# Patient Record
Sex: Female | Born: 1975 | Race: White | Hispanic: No | Marital: Married | Smoking: Never smoker
Health system: Southern US, Community
[De-identification: ages and names within clinical notes are randomized; demographics above are authoritative.]

## PROBLEM LIST (undated history)

## (undated) DIAGNOSIS — R7303 Prediabetes: Secondary | ICD-10-CM

## (undated) DIAGNOSIS — Z8041 Family history of malignant neoplasm of ovary: Secondary | ICD-10-CM

## (undated) DIAGNOSIS — G902 Horner's syndrome: Secondary | ICD-10-CM

## (undated) DIAGNOSIS — K219 Gastro-esophageal reflux disease without esophagitis: Secondary | ICD-10-CM

## (undated) DIAGNOSIS — E559 Vitamin D deficiency, unspecified: Secondary | ICD-10-CM

## (undated) DIAGNOSIS — E282 Polycystic ovarian syndrome: Secondary | ICD-10-CM

## (undated) DIAGNOSIS — M1990 Unspecified osteoarthritis, unspecified site: Secondary | ICD-10-CM

## (undated) DIAGNOSIS — Z1371 Encounter for nonprocreative screening for genetic disease carrier status: Secondary | ICD-10-CM

## (undated) HISTORY — DX: Vitamin D deficiency, unspecified: E55.9

## (undated) HISTORY — DX: Unspecified osteoarthritis, unspecified site: M19.90

## (undated) HISTORY — DX: Horner's syndrome: G90.2

## (undated) HISTORY — DX: Encounter for nonprocreative screening for genetic disease carrier status: Z13.71

## (undated) HISTORY — PX: HERNIA REPAIR: SHX51

## (undated) HISTORY — DX: Prediabetes: R73.03

## (undated) HISTORY — DX: Polycystic ovarian syndrome: E28.2

## (undated) HISTORY — PX: SMALL INTESTINE SURGERY: SHX150

## (undated) HISTORY — DX: Gastro-esophageal reflux disease without esophagitis: K21.9

## (undated) HISTORY — DX: Family history of malignant neoplasm of ovary: Z80.41

---

## 1996-10-08 HISTORY — PX: TUBAL LIGATION: SHX77

## 2003-10-09 HISTORY — PX: BREAST BIOPSY: SHX20

## 2005-01-22 ENCOUNTER — Other Ambulatory Visit: Admission: RE | Admit: 2005-01-22 | Discharge: 2005-01-22 | Payer: Self-pay | Admitting: Obstetrics & Gynecology

## 2005-02-01 ENCOUNTER — Ambulatory Visit (HOSPITAL_COMMUNITY): Admission: RE | Admit: 2005-02-01 | Discharge: 2005-02-01 | Payer: Self-pay | Admitting: Obstetrics & Gynecology

## 2009-08-03 ENCOUNTER — Ambulatory Visit: Payer: Self-pay | Admitting: Family Medicine

## 2010-01-06 ENCOUNTER — Ambulatory Visit: Payer: Self-pay | Admitting: Obstetrics and Gynecology

## 2010-01-10 ENCOUNTER — Ambulatory Visit: Payer: Self-pay | Admitting: Obstetrics and Gynecology

## 2012-02-11 ENCOUNTER — Ambulatory Visit: Payer: Self-pay | Admitting: Surgery

## 2012-02-11 LAB — CBC
HCT: 37.3 % (ref 35.0–47.0)
HGB: 12.6 g/dL (ref 12.0–16.0)
MCH: 31.9 pg (ref 26.0–34.0)
MCHC: 33.8 g/dL (ref 32.0–36.0)
MCV: 94 fL (ref 80–100)
Platelet: 282 10*3/uL (ref 150–440)
RBC: 3.96 10*6/uL (ref 3.80–5.20)
RDW: 16.1 % — ABNORMAL HIGH (ref 11.5–14.5)
WBC: 6.4 10*3/uL (ref 3.6–11.0)

## 2012-02-11 LAB — BASIC METABOLIC PANEL
Anion Gap: 5 — ABNORMAL LOW (ref 7–16)
BUN: 9 mg/dL (ref 7–18)
Calcium, Total: 9.3 mg/dL (ref 8.5–10.1)
Chloride: 103 mmol/L (ref 98–107)
Co2: 29 mmol/L (ref 21–32)
Creatinine: 0.53 mg/dL — ABNORMAL LOW (ref 0.60–1.30)
EGFR (African American): >60
EGFR (Non-African Amer.): >60
Glucose: 86 mg/dL (ref 65–99)
Osmolality: 272 (ref 275–301)
Potassium: 4.4 mmol/L (ref 3.5–5.1)
Sodium: 137 mmol/L (ref 136–145)

## 2012-02-15 ENCOUNTER — Ambulatory Visit: Payer: Self-pay | Admitting: Surgery

## 2012-02-15 LAB — PREGNANCY, URINE: Pregnancy Test, Urine: NEGATIVE m[IU]/mL

## 2012-02-18 LAB — PATHOLOGY REPORT

## 2012-08-19 ENCOUNTER — Ambulatory Visit: Payer: Self-pay | Admitting: Family Medicine

## 2014-03-08 HISTORY — PX: ABDOMINAL SURGERY: SHX537

## 2014-03-08 HISTORY — PX: FEMUR SURGERY: SHX943

## 2014-04-26 ENCOUNTER — Other Ambulatory Visit: Payer: Self-pay | Admitting: *Deleted

## 2014-04-26 DIAGNOSIS — S2249XA Multiple fractures of ribs, unspecified side, initial encounter for closed fracture: Secondary | ICD-10-CM

## 2014-04-26 DIAGNOSIS — E669 Obesity, unspecified: Secondary | ICD-10-CM | POA: Insufficient documentation

## 2014-04-26 DIAGNOSIS — K449 Diaphragmatic hernia without obstruction or gangrene: Secondary | ICD-10-CM | POA: Insufficient documentation

## 2014-04-26 DIAGNOSIS — J939 Pneumothorax, unspecified: Secondary | ICD-10-CM

## 2014-04-26 HISTORY — DX: Pneumothorax, unspecified: J93.9

## 2014-04-26 HISTORY — DX: Multiple fractures of ribs, unspecified side, initial encounter for closed fracture: S22.49XA

## 2014-04-26 MED ORDER — OXYCODONE HCL 5 MG PO CAPS: 5.0000 mg | ORAL_CAPSULE | Freq: Four times a day (QID) | ORAL | Status: DC | PRN

## 2014-04-26 NOTE — Telephone Encounter (Signed)
rx faxed to pharmacy manually

## 2014-04-29 DIAGNOSIS — S2249XA Multiple fractures of ribs, unspecified side, initial encounter for closed fracture: Secondary | ICD-10-CM

## 2014-04-29 DIAGNOSIS — S7290XA Unspecified fracture of unspecified femur, initial encounter for closed fracture: Secondary | ICD-10-CM

## 2014-04-29 DIAGNOSIS — R7301 Impaired fasting glucose: Secondary | ICD-10-CM

## 2014-04-29 DIAGNOSIS — S82899A Other fracture of unspecified lower leg, initial encounter for closed fracture: Secondary | ICD-10-CM

## 2014-04-29 DIAGNOSIS — G909 Disorder of the autonomic nervous system, unspecified: Secondary | ICD-10-CM

## 2014-05-07 DIAGNOSIS — R232 Flushing: Secondary | ICD-10-CM

## 2014-05-19 DIAGNOSIS — B379 Candidiasis, unspecified: Secondary | ICD-10-CM

## 2014-05-20 DIAGNOSIS — R7301 Impaired fasting glucose: Secondary | ICD-10-CM

## 2014-07-12 ENCOUNTER — Encounter: Payer: Self-pay | Admitting: Orthopedic Surgery

## 2014-08-08 ENCOUNTER — Encounter: Payer: Self-pay | Admitting: Orthopedic Surgery

## 2014-08-20 ENCOUNTER — Ambulatory Visit: Payer: Self-pay | Admitting: Family Medicine

## 2014-09-07 ENCOUNTER — Encounter: Payer: Self-pay | Admitting: Orthopedic Surgery

## 2014-10-08 ENCOUNTER — Encounter: Payer: Self-pay | Admitting: Orthopedic Surgery

## 2014-11-08 ENCOUNTER — Encounter: Payer: Self-pay | Admitting: Orthopedic Surgery

## 2014-12-07 ENCOUNTER — Encounter
Admit: 2014-12-07 | Disposition: A | Discharge status: Home / Self Care | Payer: Self-pay | Attending: Orthopedic Surgery | Admitting: Orthopedic Surgery

## 2015-01-30 NOTE — Op Note (Signed)
  Loreli Dollar MD ELECTRONICALLY SIGNED 02/15/2012 18:58

## 2015-01-30 NOTE — Op Note (Signed)
PATIENT NAME:  Mariah Martin, Mariah Martin MR#:  [id number] DATE OF BIRTH:  Jan 31, 1976  DATE OF PROCEDURE:  02/15/2012  PREOPERATIVE DIAGNOSIS:  Pilonidal cyst.   POSTOPERATIVE DIAGNOSIS: Pilonidal cyst.  PROCEDURE: Excision of pilonidal cyst.   ANESTHESIA: General.   INDICATIONS: This 39 year old female has a history of abscessed pilonidal cyst. The infection has subsequently resolved and excision was recommended to avoid future infections.  DESCRIPTION OF PROCEDURE:  The patient was brought into the operating room under general endotracheal anesthesia she was rolled from the stretcher onto the operating table well padded and the buttocks were spread with 3-inch tape. Two pores could be seen in the sacrococcygeal area. This area was prepared with Betadine solution and draped in a sterile manner. The lowermost pore was probed with laryngeal probe and went in a cephalad direction  approximately 4 cm. Next, a longitudinally-oriented elliptical excision was carried out with a narrow ellipse of skin and dissected down around the probe and removed the entire tract. This was submitted in formalin for routine pathology. Multiple small bleeding points were cauterized. The tissues were infiltrated with 0.5% Sensorcaine with epinephrine and the wound was observed for a period of time. Several small bleeding points were cauterized. Hemostasis subsequently appeared to be intact. Next portions of subcutaneous tissues were closed with 4-0 Monocryl. Also portions of the wound were closed with interrupted 4-0 Monocryl subcuticular suture and then the wound was further approximated with interrupted 3-0 nylon vertical mattress sutures. Next, a suture line was treated with Betadine paint and then 4 x 4 gauze dressings were applied with benzoin and two-inch silk tape.   The patient tolerated the procedure satisfactorily and is now being prepared for transfer to the recovery room.      ____________________________ Lenna Sciara. Rochel Brome, MD jws:bjt D: 02/15/2012 10:25:57 ET T: 02/15/2012 12:33:18 ET JOB#: [id number]  cc: Loreli Dollar, MD, <Dictator> Loreli Dollar MD ELECTRONICALLY SIGNED 02/15/2012 18:58

## 2015-03-09 HISTORY — PX: OTHER SURGICAL HISTORY: SHX169

## 2015-04-08 HISTORY — PX: KNEE SURGERY: SHX244

## 2015-07-20 ENCOUNTER — Other Ambulatory Visit: Payer: Self-pay | Admitting: Family Medicine

## 2015-07-20 DIAGNOSIS — Z8709 Personal history of other diseases of the respiratory system: Secondary | ICD-10-CM | POA: Insufficient documentation

## 2015-07-20 DIAGNOSIS — R739 Hyperglycemia, unspecified: Secondary | ICD-10-CM | POA: Insufficient documentation

## 2015-07-20 NOTE — Telephone Encounter (Signed)
Last OV 10/2014  Thanks,   -Mickel Baas

## 2015-07-22 LAB — LIPID PANEL
Cholesterol: 198 mg/dL (ref 0–200)
HDL: 52 mg/dL (ref 35–70)
LDL Cholesterol: 104 mg/dL
LDl/HDL Ratio: 3.8
Triglycerides: 210 mg/dL — AB (ref 40–160)

## 2015-07-22 LAB — CBC AND DIFFERENTIAL
HCT: 35 % — AB (ref 36–46)
Hemoglobin: 11.2 g/dL — AB (ref 12.0–16.0)
Neutrophils Absolute: 5 /uL
Platelets: 312 10*3/uL (ref 150–399)
WBC: 7.8 10^3/mL

## 2015-07-22 LAB — BASIC METABOLIC PANEL
BUN: 10 mg/dL (ref 4–21)
Creatinine: 0.6 mg/dL (ref 0.5–1.1)
Glucose: 102 mg/dL
Potassium: 4.5 mmol/L (ref 3.4–5.3)
Sodium: 140 mmol/L (ref 137–147)

## 2015-07-22 LAB — HEPATIC FUNCTION PANEL
ALT: 8 U/L (ref 7–35)
AST: 11 U/L — AB (ref 13–35)
Alkaline Phosphatase: 106 U/L (ref 25–125)
Bilirubin, Total: 0.2 mg/dL

## 2015-07-22 LAB — TSH: TSH: 3.41 u[IU]/mL (ref 0.41–5.90)

## 2015-07-22 LAB — HEMOGLOBIN A1C: Hemoglobin A1C: 5.7

## 2015-08-03 ENCOUNTER — Ambulatory Visit: Payer: BLUE CROSS/BLUE SHIELD | Attending: Orthopedic Surgery | Admitting: Physical Therapy

## 2015-08-03 DIAGNOSIS — M25562 Pain in left knee: Secondary | ICD-10-CM | POA: Diagnosis present

## 2015-08-03 DIAGNOSIS — R262 Difficulty in walking, not elsewhere classified: Secondary | ICD-10-CM

## 2015-08-03 DIAGNOSIS — M25662 Stiffness of left knee, not elsewhere classified: Secondary | ICD-10-CM

## 2015-08-03 DIAGNOSIS — M6281 Muscle weakness (generalized): Secondary | ICD-10-CM

## 2015-08-03 DIAGNOSIS — M25571 Pain in right ankle and joints of right foot: Secondary | ICD-10-CM

## 2015-08-04 NOTE — Therapy (Deleted)
[facility] [address] [place], [place], [address] Phone: [phone]   Fax:  [phone]  Physical Therapy Evaluation  Patient Details  Name: Mariah Martin MRN: [id number] Date of Birth: 15-Oct-1975 No Data Recorded  Encounter Date: 08/03/2015    No past medical history on file.  No past surgical history on file.  There were no vitals filed for this visit.  Visit Diagnosis:  Left knee pain  Right ankle pain  Muscle weakness  Joint stiffness of knee, left  Difficulty walking    See certification    Problem List Patient Active Problem List   Diagnosis Date Noted  . Blood glucose elevated 07/20/2015  . H/O pneumothorax 07/20/2015   Pura Spice, PT, DPT # [phone]   08/04/2015, 5:41 PM  [facility] [address] [place], [place], [address] Phone: [phone]   Fax:  [phone]  Name: Mariah Martin MRN: [id number] Date of Birth: 1976/01/19

## 2015-08-08 ENCOUNTER — Encounter: Payer: Self-pay | Admitting: Physical Therapy

## 2015-08-09 ENCOUNTER — Ambulatory Visit: Payer: BLUE CROSS/BLUE SHIELD | Attending: Orthopedic Surgery | Admitting: Physical Therapy

## 2015-08-09 DIAGNOSIS — M25571 Pain in right ankle and joints of right foot: Secondary | ICD-10-CM | POA: Insufficient documentation

## 2015-08-09 DIAGNOSIS — M25562 Pain in left knee: Secondary | ICD-10-CM | POA: Insufficient documentation

## 2015-08-09 DIAGNOSIS — M6281 Muscle weakness (generalized): Secondary | ICD-10-CM | POA: Insufficient documentation

## 2015-08-09 DIAGNOSIS — R262 Difficulty in walking, not elsewhere classified: Secondary | ICD-10-CM | POA: Insufficient documentation

## 2015-08-09 DIAGNOSIS — M25662 Stiffness of left knee, not elsewhere classified: Secondary | ICD-10-CM

## 2015-08-09 NOTE — Therapy (Signed)
[place] [facility] [address] [place], [address] Phone: [phone]   Fax:  [phone]  Physical Therapy Treatment  Patient Details  Name: Mariah Martin MRN: [id number] Date of Birth: [birth year] Referring Provider: Drue Dun  Encounter Date: 08/09/2015      PT End of Session - 08/09/15 0842    Visit Number 2   Number of Visits 16   Date for PT Re-Evaluation 09/28/15   PT Start Time 0735   PT Stop Time 0820   PT Time Calculation (min) 45 min   Activity Tolerance Patient tolerated treatment well;No increased pain;Patient limited by fatigue   Behavior During Therapy [facility] for tasks assessed/performed      No past medical history on file.  No past surgical history on file.  There were no vitals filed for this visit.  Visit Diagnosis:  Muscle weakness  Joint stiffness of knee, left  Difficulty walking      Subjective Assessment - 08/09/15 0838    Subjective Patient reports she is doing great. Says she spent the weekend at the beach and had a good time, was able to get into the pool some. Reports knees feel tight, might be due to riding [employer] yesterday for 30 min.    Limitations Lifting;Standing;Walking;House hold activities;Other (comment)   Patient Stated Goals Increase L knee ROM/strength and return to walking with appropriate assistive device safely.    Currently in Pain? Yes   Pain Location Knee   Pain Orientation Right;Left   Pain Descriptors / Indicators Tightness   Pain Type Chronic pain   Pain Onset More than a month ago   Multiple Pain Sites No                     Adult Aquatic Therapy - 08/09/15 0840    Aquatic Therapy Subjective   Subjective Patient reports she is feeling great.    Treatment   Gait Entered/exited pool via ramp with handrail assist.  Performed 3 laps of forward, lateral and backward walking.    Exercises Standing exercises to include Hip  abduction, marching, hip extension, knee flexion, squats, heel raises, seated laq, each x 2 min. Kicking across pool x 1 with noodle assist.                     PT Education - 08/09/15 [phone]    Education provided Yes   Education Details aquatic exercises   Person(s) Educated Patient   Methods Explanation;Demonstration   Comprehension Verbalized understanding;Returned demonstration             PT Long Term Goals - 08/03/15 0823    PT LONG TERM GOAL #1   Title Pt. I with HEP to increase L knee AROM flexion to >100 deg. to improve transfers/ walking with appropriate assistive device.    Baseline 0 to 94 deg.   Time 4   Period Weeks   Status New   PT LONG TERM GOAL #2   Title Pt. will increase LEFS to >40 out of 80 to improve overall functional mobility with household/ work-related tasks.    Baseline 19 out of 80 on 10/26   Time 6   Period Weeks   Status New   PT LONG TERM GOAL #3   Title Pt. able to ambulate 100 feet with mod. I and RW on level surfaces at work to be able to go to copy room/ restroom safely.  Baseline currently using scooter   Time 6   Period Weeks   Status New   PT LONG TERM GOAL #4   Title Pt. able to ascend/descend 4 steps with proper step to gait pattern and 1 handrail safely.     Baseline pain limited with recip. pattern.   Time 8   Period Weeks   Status New   PT LONG TERM GOAL #5   Title Pt. will increase L hip/knee strength to 4+/5 MMT to improve standing tolerance/ endurance with gait pattern.     Time 8   Period Weeks   Status New   Additional Long Term Goals   Additional Long Term Goals Yes   PT LONG TERM GOAL #6   Title Pt. able to ambulate community distances with least assistive device with mod. I safely on level surfaces to improve overall mobility.     Time 8   Period Weeks   Status New               Plan - 08/09/15 0843    Clinical Impression Statement Patient performed all activities with cues and  demonstration in aquatic environment. Pt reports fatigue at end of session.    Pt will benefit from skilled therapeutic intervention in order to improve on the following deficits Abnormal gait;Decreased strength;Improper body mechanics;Postural dysfunction;Difficulty walking;Decreased activity tolerance;Decreased mobility;Impaired flexibility;Obesity;Decreased range of motion;Hypermobility;Decreased balance;Pain;Hypomobility;Decreased endurance;Decreased scar mobility   Rehab Potential Good   PT Frequency 2x / week   PT Duration 8 weeks   PT Treatment/Interventions ADLs/Self Care Home Management;DME Instruction;Gait training;Stair training;Aquatic Therapy;Functional mobility training;Manual techniques;Cryotherapy;Electrical Stimulation;Moist Heat;Patient/family education;Neuromuscular re-education;Balance training;Therapeutic exercise;Therapeutic activities;Passive range of motion;Energy conservation   Consulted and Agree with Plan of Care Patient        Problem List Patient Active Problem List   Diagnosis Date Noted  . Blood glucose elevated 07/20/2015  . H/O pneumothorax 07/20/2015    Teigan Manner, PT, MPT, GCS 08/09/2015, 8:45 AM  [place] [facility] [address], [place], [address] Phone: [phone]   Fax:  [phone]  Name: Mariah Martin MRN: [id number] Date of Birth: 1975/12/21

## 2015-08-09 NOTE — Addendum Note (Signed)
Addended by: Pura Spice on: 08/09/2015 07:38 AM   Modules accepted: Orders

## 2015-08-09 NOTE — Therapy (Signed)
[facility] [address] [place], [place], [address] Phone: [phone]   Fax:  [phone]  Physical Therapy Evaluation  Patient Details  Name: Mariah Martin MRN: [id number] Date of Birth: 1976-09-12 Referring Provider: Drue Dun  Encounter Date: 08/03/2015      PT End of Session - 08/09/15 0711    Visit Number 1   Number of Visits 16   Date for PT Re-Evaluation 09/28/15   PT Start Time 0725   PT Stop Time 0831   PT Time Calculation (min) 66 min   Equipment Utilized During Treatment Gait belt   Activity Tolerance Patient tolerated treatment well;Patient limited by fatigue   Behavior During Therapy [facility] for tasks assessed/performed      History reviewed. No pertinent past medical history.  History reviewed. No pertinent past surgical history.  There were no vitals filed for this visit.  Visit Diagnosis:  Left knee pain  Right ankle pain  Muscle weakness  Joint stiffness of knee, left  Difficulty walking      Subjective Assessment - 08/09/15 0707    Subjective Pt. states she was non-wt. bearing on L LE for past 3 months and relied on use of scooter at [employer].  Pt. states MD feels she will always require use of assistive device with walking due to limited L distal femur healing/ extent of hardware.  Pt. reports minimal L knee/LE pain at rest and increase discomfort with standing/walking tasks.     Limitations Lifting;Standing;Walking;House hold activities;Other (comment)   Patient Stated Goals Increase L knee ROM/strength and return to walking with appropriate assistive device safely.    Currently in Pain? 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 1   Has the patient had a decrease in activity level because of a fear of falling?  Yes   Is the patient reluctant to leave their home because of a fear of falling?  Yes           PT Education - 08/09/15 0710    Education provided Yes   Education Details Pool exercise program/ See HEP (quad sets/ heel slides/ SLR/ hip abd. in supine position).  Pt. will begin with in pool next Tuesday (08/09/15) with progression to land based ex.    Person(s) Educated Patient   Methods Explanation;Demonstration;Verbal cues;Handout   Comprehension Verbalized understanding;Returned demonstration;Verbal cues required             PT Long Term Goals - 08/03/15 0823    PT LONG TERM GOAL #1   Title Pt. I with HEP to increase L knee AROM flexion to >100 deg. to improve transfers/ walking with appropriate assistive device.    Baseline 0 to 94 deg.   Time 4   Period Weeks   Status New   PT LONG TERM GOAL #2   Title Pt. will increase LEFS to >40 out of 80 to improve overall functional mobility with household/ work-related  tasks.    Baseline 19 out of 80 on 10/26   Time 6   Period Weeks   Status New   PT LONG TERM GOAL #3   Title Pt. able to ambulate 100 feet with mod. I and RW on level surfaces at work to be able to go to copy room/ restroom safely.    Baseline currently using scooter   Time 6   Period Weeks   Status New   PT LONG TERM GOAL #4   Title Pt. able to ascend/descend 4 steps with proper step to gait pattern and 1 handrail safely.     Baseline pain limited with recip. pattern.   Time 8   Period Weeks   Status New   PT LONG TERM GOAL #5   Title Pt. will increase L hip/knee strength to 4+/5 MMT to improve standing tolerance/ endurance with gait pattern.     Time 8   Period Weeks   Status New   Additional Long Term Goals   Additional  Long Term Goals Yes   PT LONG TERM GOAL #6   Title Pt. able to ambulate community distances with least assistive device with mod. I safely on level surfaces to improve overall mobility.     Time 8   Period Weeks   Status New               Plan - 08/09/15 3664    Clinical Impression Statement Pt. 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 bearing.  Pt. know to PT after initial surgery and progression to independent gym based ex. program pror to revison.  Pt. reports minimal L knee pain at rest in sitting posture and an increase in L knee pain >2/10 with sit to standing/ static standing/walking in clinic with use of RW.  Pt. presents with decrease L knee AROM in supine (0 to 94 deg.) and PROM (+3 to 98 deg.)- pain limited/ muscle tightness.  Significant L < R LLD noted in supine/standing by >1.5 inches and benefits from external shoe lift on L with standing/wlaking to improve hip symmetry.  R LE AROM WNL and strength grossly 5/5 MMT.  L LE strength: hamstring 5/5 MMT, quad 3+/5, hip flexor 3+/5 and DF 5/5 MMT.  LEFS: 19 out of 80.  Pt. able to ambulates short distances (<50 feet) in clinic with use of RW and L antalgic gait pattern with decrease stance time on L with R swing through phase of gait.  Decrease L hip/knee flexion with recip. step pattern and significant use of B UE with wt. bearing/ posture correction.  Pt. able to ascend/ descend 4 step with step to pattern and 1 handrail safely with extra time and min. A required for verbal suing.  Pt. understands importance of HEP and will start PT in pool to progress wt. bearing tolerance/ walking/ endurance prior to progressing with land based ex.  Pt. will benefit from PT 2x/week x 8 weeks at this time with progressive HEP to improve walking status/ decrease use of scooter.     Pt will benefit from skilled therapeutic intervention in order to improve on the  following deficits Abnormal gait;Decreased strength;Improper body mechanics;Postural dysfunction;Difficulty walking;Decreased activity tolerance;Decreased mobility;Impaired flexibility;Obesity;Decreased range of motion;Hypermobility;Decreased balance;Pain;Hypomobility;Decreased endurance;Decreased scar mobility   Rehab Potential Good   PT Frequency 2x / week   PT Duration 8 weeks   PT Treatment/Interventions ADLs/Self Care  Home Management;DME Instruction;Gait training;Stair training;Aquatic Therapy;Functional mobility training;Manual techniques;Cryotherapy;Electrical Stimulation;Moist Heat;Patient/family education;Neuromuscular re-education;Balance training;Therapeutic exercise;Therapeutic activities;Passive range of motion;Energy conservation   PT Next Visit Plan Pool ex. program (issue an independent pool based ex. program so pt. can progress to [facility] pool on an independent basis with min. assist from [place] [facility]).     PT Home Exercise Plan see handouts   Recommended Other Services [place] pool/ gym.     Consulted and Agree with Plan of Care Patient         Problem List Patient Active Problem List   Diagnosis Date Noted  . Blood glucose elevated 07/20/2015  . H/O pneumothorax 07/20/2015   Pura Spice, PT, DPT # [phone]   08/09/2015, 7:33 AM  [facility] [address] [place], [place], [address] Phone: [phone]   Fax:  [phone]  Name: Mariah Martin MRN: [id number] Date of Birth: 1976/03/03

## 2015-08-11 ENCOUNTER — Ambulatory Visit: Payer: BLUE CROSS/BLUE SHIELD | Admitting: Physical Therapy

## 2015-08-16 ENCOUNTER — Ambulatory Visit: Payer: BLUE CROSS/BLUE SHIELD | Admitting: Physical Therapy

## 2015-08-16 DIAGNOSIS — M25571 Pain in right ankle and joints of right foot: Secondary | ICD-10-CM

## 2015-08-16 DIAGNOSIS — M25662 Stiffness of left knee, not elsewhere classified: Secondary | ICD-10-CM

## 2015-08-16 DIAGNOSIS — M25562 Pain in left knee: Secondary | ICD-10-CM

## 2015-08-16 DIAGNOSIS — M6281 Muscle weakness (generalized): Secondary | ICD-10-CM

## 2015-08-16 DIAGNOSIS — R262 Difficulty in walking, not elsewhere classified: Secondary | ICD-10-CM

## 2015-08-16 NOTE — Therapy (Signed)
[place] [facility] [address], [place], [address] Phone: [phone]   Fax:  [phone]  Physical Therapy Treatment  Patient Details  Name: Mariah Martin MRN: [id number] Date of Birth: 1976/04/16 Referring Provider: Drue Dun  Encounter Date: 08/16/2015      PT End of Session - 08/16/15 0819    Visit Number 3   Number of Visits 16   Date for PT Re-Evaluation 09/28/15   PT Start Time 0710   PT Stop Time 0800   PT Time Calculation (min) 50 min   Activity Tolerance Patient tolerated treatment well;No increased pain;Patient limited by fatigue   Behavior During Therapy [facility] for tasks assessed/performed      No past medical history on file.  No past surgical history on file.  There were no vitals filed for this visit.  Visit Diagnosis:  Muscle weakness  Joint stiffness of knee, left  Difficulty walking  Left knee pain  Right ankle pain      Subjective Assessment - 08/16/15 0815    Subjective Patient states she is good, reports left knee tightness.    Limitations Lifting;Standing;Walking;House hold activities;Other (comment)   Patient Stated Goals Increase L knee ROM/strength and return to walking with appropriate assistive device safely.    Currently in Pain? No/denies                     Adult Aquatic Therapy - 08/16/15 0816    Aquatic Therapy Subjective   Subjective Patient reports left knee stiffness and right ankle swelling   Treatment   Gait Entered/exited pool via ramp with handrail assist.  Performed 3 laps of forward, lateral and backward walking.    Exercises Standing exercises to include Hip abduction, marching, hip extension, knee flexion, squats, heel raises, seated laq, each x 2 min. Kicking across pool x 2 with kickboard/noodle assist ( 1x prone 1x supine.).                     PT Education - 08/16/15 0817    Education provided Yes   Education  Details aquatic exercises   Person(s) Educated Patient   Methods Explanation;Demonstration;Verbal cues   Comprehension Verbalized understanding;Returned demonstration;Verbal cues required             PT Long Term Goals - 08/03/15 0823    PT LONG TERM GOAL #1   Title Pt. I with HEP to increase L knee AROM flexion to >100 deg. to improve transfers/ walking with appropriate assistive device.    Baseline 0 to 94 deg.   Time 4   Period Weeks   Status New   PT LONG TERM GOAL #2   Title Pt. will increase LEFS to >40 out of 80 to improve overall functional mobility with household/ work-related tasks.    Baseline 19 out of 80 on 10/26   Time 6   Period Weeks   Status New   PT LONG TERM GOAL #3   Title Pt. able to ambulate 100 feet with mod. I and RW on level surfaces at work to be able to go to copy room/ restroom safely.    Baseline currently using scooter   Time 6   Period Weeks   Status New   PT LONG TERM GOAL #4   Title Pt. able to ascend/descend 4 steps with proper step to gait pattern and 1 handrail safely.     Baseline pain limited with recip.  pattern.   Time 8   Period Weeks   Status New   PT LONG TERM GOAL #5   Title Pt. will increase L hip/knee strength to 4+/5 MMT to improve standing tolerance/ endurance with gait pattern.     Time 8   Period Weeks   Status New   Additional Long Term Goals   Additional Long Term Goals Yes   PT LONG TERM GOAL #6   Title Pt. able to ambulate community distances with least assistive device with mod. I safely on level surfaces to improve overall mobility.     Time 8   Period Weeks   Status New               Plan - 08/16/15 0820    Clinical Impression Statement Patient doing well with all aquatic activities. Patient reports feeling improved mobility in left knee after session.    Pt will benefit from skilled therapeutic intervention in order to improve on the following deficits Abnormal gait;Decreased strength;Improper body  mechanics;Postural dysfunction;Difficulty walking;Decreased activity tolerance;Decreased mobility;Impaired flexibility;Obesity;Decreased range of motion;Hypermobility;Decreased balance;Pain;Hypomobility;Decreased endurance;Decreased scar mobility   Rehab Potential Good   PT Frequency 2x / week   PT Duration 8 weeks   PT Treatment/Interventions ADLs/Self Care Home Management;DME Instruction;Gait training;Stair training;Aquatic Therapy;Functional mobility training;Manual techniques;Cryotherapy;Electrical Stimulation;Moist Heat;Patient/family education;Neuromuscular re-education;Balance training;Therapeutic exercise;Therapeutic activities;Passive range of motion;Energy conservation   PT Next Visit Plan Pool ex. program (issue an independent pool based ex. program so pt. can progress to [facility] pool on an independent basis with min. assist from [place] [facility]).     Consulted and Agree with Plan of Care Patient        Problem List Patient Active Problem List   Diagnosis Date Noted  . Blood glucose elevated 07/20/2015  . H/O pneumothorax 07/20/2015    Telisha Zawadzki, PT, MPT, GCS 08/16/2015, 8:22 AM  [place] [facility] [address] [place], [address] Phone: [phone]   Fax:  [phone]  Name: Mariah Martin MRN: [id number] Date of Birth: 16-Jul-1976

## 2015-08-18 ENCOUNTER — Ambulatory Visit: Payer: BLUE CROSS/BLUE SHIELD | Admitting: Physical Therapy

## 2015-08-18 DIAGNOSIS — R262 Difficulty in walking, not elsewhere classified: Secondary | ICD-10-CM

## 2015-08-18 DIAGNOSIS — M6281 Muscle weakness (generalized): Secondary | ICD-10-CM

## 2015-08-18 DIAGNOSIS — M25662 Stiffness of left knee, not elsewhere classified: Secondary | ICD-10-CM

## 2015-08-18 NOTE — Therapy (Signed)
[facility] [address], [place], [address] Phone: [phone]   Fax:  [phone]  Physical Therapy Treatment  Patient Details  Name: Mariah Martin MRN: [id number] Date of Birth: Jun 20, 1976 Referring Provider: Drue Dun  Encounter Date: 08/18/2015      PT End of Session - 08/18/15 0840    Visit Number 4   Number of Visits 16   Date for PT Re-Evaluation 09/28/15   PT Start Time 0720   PT Stop Time 0815   PT Time Calculation (min) 55 min   Activity Tolerance Patient tolerated treatment well;No increased pain   Behavior During Therapy [facility] for tasks assessed/performed      No past medical history on file.  No past surgical history on file.  There were no vitals filed for this visit.  Visit Diagnosis:  Muscle weakness  Joint stiffness of knee, left  Difficulty walking      Subjective Assessment - 08/18/15 0836    Subjective Patient reports she feels tight in the am in her left knee.   Limitations Lifting;Standing;Walking;House hold activities;Other (comment)   Patient Stated Goals Increase L knee ROM/strength and return to walking with appropriate assistive device safely.    Currently in Pain? No/denies                     Adult Aquatic Therapy - 08/18/15 0837    Aquatic Therapy Subjective   Subjective Patient reports left knee stiffness and right ankle swelling   Treatment   Gait Entered/exited pool via ramp with handrail assist.  Performed 6 laps of forward, 6 laps lateral and 3 laps backward walking.    Exercises Standing exercises to include Hip abduction, marching, hip extension, knee flexion, squats, seated bicycles, laq, each x 2 min. Kicking across pool x 2 laps with kickboard assist and 1x across pool using arms and legs..                     PT Education - 08/18/15 [phone]    Education provided Yes   Education Details importance of knee extension  for proper gait pattern.Aquatic exercises.     Person(s) Educated Patient   Methods Explanation   Comprehension Verbalized understanding             PT Long Term Goals - 08/03/15 0823    PT LONG TERM GOAL #1   Title Pt. I with HEP to increase L knee AROM flexion to >100 deg. to improve transfers/ walking with appropriate assistive device.    Baseline 0 to 94 deg.   Time 4   Period Weeks   Status New   PT LONG TERM GOAL #2   Title Pt. will increase LEFS to >40 out of 80 to improve overall functional mobility with household/ work-related tasks.    Baseline 19 out of 80 on 10/26   Time 6   Period Weeks   Status New   PT LONG TERM GOAL #3   Title Pt. able to ambulate 100 feet with mod. I and RW on level surfaces at work to be able to go to copy room/ restroom safely.    Baseline currently using scooter   Time 6   Period Weeks   Status New   PT LONG TERM GOAL #4   Title Pt. able to ascend/descend 4 steps with proper step to gait pattern and 1 handrail safely.     Baseline pain  limited with recip. pattern.   Time 8   Period Weeks   Status New   PT LONG TERM GOAL #5   Title Pt. will increase L hip/knee strength to 4+/5 MMT to improve standing tolerance/ endurance with gait pattern.     Time 8   Period Weeks   Status New   Additional Long Term Goals   Additional Long Term Goals Yes   PT LONG TERM GOAL #6   Title Pt. able to ambulate community distances with least assistive device with mod. I safely on level surfaces to improve overall mobility.     Time 8   Period Weeks   Status New               Plan - 08/18/15 0841    Clinical Impression Statement Patient demonstrates independence with aquatic activities and is ready to transition to community pool. Pt reports feeling decreased stiffness after session.    Pt will benefit from skilled therapeutic intervention in order to improve on the following deficits Abnormal gait;Decreased strength;Improper body  mechanics;Postural dysfunction;Difficulty walking;Decreased activity tolerance;Decreased mobility;Impaired flexibility;Obesity;Decreased range of motion;Hypermobility;Decreased balance;Pain;Hypomobility;Decreased endurance;Decreased scar mobility   Rehab Potential Good   PT Frequency 2x / week   PT Duration 8 weeks   PT Treatment/Interventions ADLs/Self Care Home Management;DME Instruction;Gait training;Stair training;Aquatic Therapy;Functional mobility training;Manual techniques;Cryotherapy;Electrical Stimulation;Moist Heat;Patient/family education;Neuromuscular re-education;Balance training;Therapeutic exercise;Therapeutic activities;Passive range of motion;Energy conservation   PT Next Visit Plan Pool ex. program (issue an independent pool based ex. program so pt. can progress to [facility] pool on an independent basis with min. assist from [employer]).     Consulted and Agree with Plan of Care Patient        Problem List Patient Active Problem List   Diagnosis Date Noted  . Blood glucose elevated 07/20/2015  . H/O pneumothorax 07/20/2015    Burnie Therien, PT, MPT, GCS 08/18/2015, 8:43 AM  [facility] [address], [place], [address] Phone: [phone]   Fax:  [phone]  Name: Mariah Martin MRN: [id number] Date of Birth: June 09, 1976

## 2015-08-19 ENCOUNTER — Encounter: Payer: Self-pay | Admitting: Family Medicine

## 2015-08-25 ENCOUNTER — Encounter: Payer: Self-pay | Admitting: Physical Therapy

## 2015-08-25 ENCOUNTER — Ambulatory Visit: Payer: BLUE CROSS/BLUE SHIELD | Admitting: Physical Therapy

## 2015-08-25 DIAGNOSIS — M25571 Pain in right ankle and joints of right foot: Secondary | ICD-10-CM

## 2015-08-25 DIAGNOSIS — M25662 Stiffness of left knee, not elsewhere classified: Secondary | ICD-10-CM

## 2015-08-25 DIAGNOSIS — M6281 Muscle weakness (generalized): Secondary | ICD-10-CM

## 2015-08-25 DIAGNOSIS — M25562 Pain in left knee: Secondary | ICD-10-CM

## 2015-08-25 DIAGNOSIS — R262 Difficulty in walking, not elsewhere classified: Secondary | ICD-10-CM

## 2015-08-25 NOTE — Therapy (Signed)
[facility] [address] [place], [place], [address] Phone: [phone]   Fax:  [phone]  Physical Therapy Treatment  Patient Details  Name: Mariah Martin MRN: [id number] Date of Birth: 04-Dec-1975 Referring Provider: Drue Dun  Encounter Date: 08/25/2015      PT End of Session - 08/25/15 1426    Visit Number 5   Number of Visits 16   Date for PT Re-Evaluation 09/28/15   PT Start Time 0720   PT Stop Time 0827   PT Time Calculation (min) 67 min   Activity Tolerance Patient tolerated treatment well;Patient limited by pain;Patient limited by fatigue   Behavior During Therapy [facility] for tasks assessed/performed      History reviewed. No pertinent past medical history.  History reviewed. No pertinent past surgical history.  There were no vitals filed for this visit.  Visit Diagnosis:  Muscle weakness  Joint stiffness of knee, left  Difficulty walking  Left knee pain  Right ankle pain      Subjective Assessment - 08/25/15 1424    Subjective Pt reports stiffness and soreness when waking up and with the colder weather. Pt reports feeling only R ankle. Pt states that her L knee feels better than it did last week.   Limitations Lifting;Standing;Walking;House hold activities;Other (comment)   Patient Stated Goals Increase L knee ROM/strength and return to walking with appropriate assistive device safely.    Currently in Pain? Yes   Pain Score 4    Pain Location Ankle   Pain Orientation Right   Pain Frequency Intermittent      OBJECTIVE: There ex: Forwards and backwards walking with B UE light touch in // bars x 6. Side stepping in the // bars with light touch UE support x 8. Walking marching in the // bars. Squats 15 x 2 in the parallel bars with light touch UE support. Pt required verbal cuing to have proper knee alignment but corrected after cuing and maintained corrections. Supine bridging with TrA contraction   x20 with good control noted. No pelvic rotation with bridging. TrA contraction in supine with marching and bicycling 15 x 2 each motion. Good TrA contraction throughout the entire exercise. Pelvic tilts with TrA contraction x 30 with good form noted. Pt reports relief with pelvic tilts. Neuro re-ed: On blue airex pad, marching/heel raises x 30 each. Airex eyes closed balance 30 seconds x 2 with light touch on the bars for stability. Pt demonstrates proper ankle strategy to maintain balance. Adducted stance on Airex 1 minute x 2 with light touch on the bars. Gait training: Ambulation on level and unlevel terrain with rollator. Increased cadence with the rollator as compared to in the // bars. Equal step length noted and proper trunk posture. Verbal cues given on unlevel terrain for control down a ramp. Transfer safely into drivers seat with cuing but no tactile assist.   Pt response to tx for medical necessity: Pt benefits from increasing endurance to help with functional mobility and work related tasks. Pt benefits from incremental gait training to increase modified independence with rollator for safe functional mobility.       PT Long Term Goals - 08/03/15 0932    PT LONG TERM GOAL #1   Title Pt. I with HEP to increase L knee AROM flexion to >100 deg. to improve transfers/ walking with appropriate assistive device.    Baseline 0 to 94 deg.   Time 4   Period Weeks  Status New   PT LONG TERM GOAL #2   Title Pt. will increase LEFS to >40 out of 80 to improve overall functional mobility with household/ work-related tasks.    Baseline 19 out of 80 on 10/26   Time 6   Period Weeks   Status New   PT LONG TERM GOAL #3   Title Pt. able to ambulate 100 feet with mod. I and RW on level surfaces at work to be able to go to copy room/ restroom safely.    Baseline currently using scooter   Time 6   Period Weeks   Status New   PT LONG TERM GOAL #4   Title Pt. able to ascend/descend 4 steps with proper  step to gait pattern and 1 handrail safely.     Baseline pain limited with recip. pattern.   Time 8   Period Weeks   Status New   PT LONG TERM GOAL #5   Title Pt. will increase L hip/knee strength to 4+/5 MMT to improve standing tolerance/ endurance with gait pattern.     Time 8   Period Weeks   Status New   Additional Long Term Goals   Additional Long Term Goals Yes   PT LONG TERM GOAL #6   Title Pt. able to ambulate community distances with least assistive device with mod. I safely on level surfaces to improve overall mobility.     Time 8   Period Weeks   Status New               Plan - 08/25/15 1426    Clinical Impression Statement Patient ambulates with rollator with increased cadence and for longer periods of time. Pt is able to maintaining standing balance with B UE support in the parallel bars. Pt requires verbal cues for squatting to perform correctly but is able to correct with cuing. Pt reports increased knee stiffness with squatting. Pt is limited in standing tolerance secondary to R ankle pain. Pt was able to naviagate unlevel terrain with the rollator without any loss of balance. Pt's L knee AROM in supine: -2 to 104 degrees.    Pt will benefit from skilled therapeutic intervention in order to improve on the following deficits Abnormal gait;Decreased strength;Improper body mechanics;Postural dysfunction;Difficulty walking;Decreased activity tolerance;Decreased mobility;Impaired flexibility;Obesity;Decreased range of motion;Hypermobility;Decreased balance;Pain;Hypomobility;Decreased endurance;Decreased scar mobility   Rehab Potential Good   PT Frequency 2x / week   PT Duration 8 weeks   PT Treatment/Interventions ADLs/Self Care Home Management;DME Instruction;Gait training;Stair training;Aquatic Therapy;Functional mobility training;Manual techniques;Cryotherapy;Electrical Stimulation;Moist Heat;Patient/family education;Neuromuscular re-education;Balance  training;Therapeutic exercise;Therapeutic activities;Passive range of motion;Energy conservation   PT Next Visit Plan Increase ambulation/standing balance/core/re issue HEP   PT Home Exercise Plan core stability handouts   Recommended Other Services discussed pool options   Consulted and Agree with Plan of Care Patient        Problem List Patient Active Problem List   Diagnosis Date Noted  . Blood glucose elevated 07/20/2015  . H/O pneumothorax 07/20/2015    Lavone Neri, SPT 08/25/2015, 4:57 PM  [facility] [address] [place], [place], [address] Phone: [phone]   Fax:  [phone]  Name: Mariah Martin MRN: [id number] Date of Birth: 1976-06-16

## 2015-08-31 ENCOUNTER — Ambulatory Visit: Payer: BLUE CROSS/BLUE SHIELD | Admitting: Physical Therapy

## 2015-08-31 ENCOUNTER — Encounter: Payer: Self-pay | Admitting: Physical Therapy

## 2015-08-31 DIAGNOSIS — M25562 Pain in left knee: Secondary | ICD-10-CM

## 2015-08-31 DIAGNOSIS — R262 Difficulty in walking, not elsewhere classified: Secondary | ICD-10-CM

## 2015-08-31 DIAGNOSIS — M6281 Muscle weakness (generalized): Secondary | ICD-10-CM

## 2015-08-31 DIAGNOSIS — M25662 Stiffness of left knee, not elsewhere classified: Secondary | ICD-10-CM

## 2015-08-31 DIAGNOSIS — M25571 Pain in right ankle and joints of right foot: Secondary | ICD-10-CM

## 2015-08-31 NOTE — Therapy (Signed)
[place] [facility] [place] [facility] [address] [place], [place], [address] Phone: [phone]   Fax:  [phone]  Physical Therapy Treatment  Patient Details  Name: Mariah Martin MRN: [id number] Date of Birth: Apr 28, 1976 Referring Provider: Drue Dun  Encounter Date: 08/31/2015      PT End of Session - 08/31/15 1710    Visit Number 6   Number of Visits 16   Date for PT Re-Evaluation 09/28/15   PT Start Time 9675   PT Stop Time 1440   PT Time Calculation (min) 66 min   Activity Tolerance Patient tolerated treatment well;Patient limited by pain;Patient limited by fatigue   Behavior During Therapy [facility] for tasks assessed/performed      History reviewed. No pertinent past medical history.  History reviewed. No pertinent past surgical history.  There were no vitals filed for this visit.  Visit Diagnosis:  Muscle weakness  Joint stiffness of knee, left  Difficulty walking  Left knee pain  Right ankle pain      Subjective Assessment - 08/31/15 1708    Subjective Pt reports increased L knee pain in the joint line after walking last session. Pt reports only feeling R ankle when standing. Pt states she is standing more throughout the day.   Limitations Lifting;Standing;Walking;House hold activities;Other (comment)   Patient Stated Goals Increase L knee ROM/strength and return to walking with appropriate assistive device safely.    Currently in Pain? Yes   Pain Score 4    Pain Location Ankle   Pain Orientation Right   Multiple Pain Sites Yes   Pain Score 4   Pain Location Knee   Pain Orientation Left     OBJECTIVE: There ex: Ambulation in the // bars with B UE support forwards with focus on increased knee flexion and heel strike. Side stepping in // bars with focus on equal weight bearing and increased step length. Gait around the gym with the rollator with cues for increased knee flexion. Attempted standing hip exercises but pt's L  LE felt unsteady to try. Standing heel raises x 10. Nustep for 12 mins on L3-L4 (no charge). Manual: in supine, stretching to L hamstrings and hip musculature. PA grade III mobilizations to femur and tibia to increase L knee flexion, 6 x 30 seconds. Pt reports no pressure or pain with mobilizations. Streching to R gastroc in supine. PROM to increase R ankle joint space.  Ice to end the session in sitting with L knee supported.  Pt response to tx for medical necessity: Pt benefits from manual to progress AROM in L LE. Strengthening in beneficial in helping pt to return to prior level of function.         PT Long Term Goals - 08/03/15 9163    PT LONG TERM GOAL #1   Title Pt. I with HEP to increase L knee AROM flexion to >100 deg. to improve transfers/ walking with appropriate assistive device.    Baseline 0 to 94 deg.   Time 4   Period Weeks   Status New   PT LONG TERM GOAL #2   Title Pt. will increase LEFS to >40 out of 80 to improve overall functional mobility with household/ work-related tasks.    Baseline 19 out of 80 on 10/26   Time 6   Period Weeks   Status New   PT LONG TERM GOAL #3   Title Pt. able to ambulate 100 feet with mod. I and RW on level surfaces  at work to be able to go to copy room/ restroom safely.    Baseline currently using scooter   Time 6   Period Weeks   Status New   PT LONG TERM GOAL #4   Title Pt. able to ascend/descend 4 steps with proper step to gait pattern and 1 handrail safely.     Baseline pain limited with recip. pattern.   Time 8   Period Weeks   Status New   PT LONG TERM GOAL #5   Title Pt. will increase L hip/knee strength to 4+/5 MMT to improve standing tolerance/ endurance with gait pattern.     Time 8   Period Weeks   Status New   Additional Long Term Goals   Additional Long Term Goals Yes   PT LONG TERM GOAL #6   Title Pt. able to ambulate community distances with least assistive device with mod. I safely on level surfaces to improve  overall mobility.     Time 8   Period Weeks   Status New            Plan - 08/31/15 1710    Clinical Impression Statement Pt ambulates with her rollator and decreased knee flexion. Pt maintains increased cadence with rollator carryover from last session. Pt is limited in standing activities secondary to instability of L knee in standing. Pt has 104 degrees of L knee flexion AROM in supine. Pt benefits from grade III mobilizations to increase knee flexion and joint space.    Pt will benefit from skilled therapeutic intervention in order to improve on the following deficits Abnormal gait;Decreased strength;Improper body mechanics;Postural dysfunction;Difficulty walking;Decreased activity tolerance;Decreased mobility;Impaired flexibility;Obesity;Decreased range of motion;Hypermobility;Decreased balance;Pain;Hypomobility;Decreased endurance;Decreased scar mobility   Rehab Potential Good   PT Frequency 2x / week   PT Duration 8 weeks   PT Treatment/Interventions ADLs/Self Care Home Management;DME Instruction;Gait training;Stair training;Aquatic Therapy;Functional mobility training;Manual techniques;Cryotherapy;Electrical Stimulation;Moist Heat;Patient/family education;Neuromuscular re-education;Balance training;Therapeutic exercise;Therapeutic activities;Passive range of motion;Energy conservation   PT Next Visit Plan Increase ambulation/standing balance/core/re issue HEP   PT Home Exercise Plan core stability handouts   Consulted and Agree with Plan of Care Patient        Problem List Patient Active Problem List   Diagnosis Date Noted  . Blood glucose elevated 07/20/2015  . H/O pneumothorax 07/20/2015   Pura Spice, PT, DPT # [phone]   08/31/2015, 5:43 PM  [facility] [address] [place], [place], [address] Phone: [phone]   Fax:  [phone]  Name: RYLIE LIMBURG MRN: [id number] Date of Birth: 1976/02/27

## 2015-09-15 ENCOUNTER — Other Ambulatory Visit: Payer: Self-pay | Admitting: Family Medicine

## 2015-09-15 ENCOUNTER — Ambulatory Visit: Payer: BLUE CROSS/BLUE SHIELD | Attending: Orthopedic Surgery | Admitting: Physical Therapy

## 2015-09-15 DIAGNOSIS — M6281 Muscle weakness (generalized): Secondary | ICD-10-CM | POA: Diagnosis not present

## 2015-09-15 DIAGNOSIS — M25571 Pain in right ankle and joints of right foot: Secondary | ICD-10-CM

## 2015-09-15 DIAGNOSIS — R262 Difficulty in walking, not elsewhere classified: Secondary | ICD-10-CM | POA: Insufficient documentation

## 2015-09-15 DIAGNOSIS — M25562 Pain in left knee: Secondary | ICD-10-CM | POA: Insufficient documentation

## 2015-09-15 DIAGNOSIS — E282 Polycystic ovarian syndrome: Secondary | ICD-10-CM

## 2015-09-15 DIAGNOSIS — M25662 Stiffness of left knee, not elsewhere classified: Secondary | ICD-10-CM | POA: Diagnosis present

## 2015-09-15 NOTE — Therapy (Signed)
[facility] [address] [place], [place], [address] Phone: [phone]   Fax:  [phone]  Physical Therapy Treatment  Patient Details  Name: Mariah Martin MRN: [id number] Date of Birth: 03-30-1976 Referring Provider: Drue Dun  Encounter Date: 09/15/2015      PT End of Session - 09/16/15 0838    Visit Number 7   Number of Visits 16   Date for PT Re-Evaluation 09/28/15   PT Start Time 0718   PT Stop Time 0825   PT Time Calculation (min) 67 min   Activity Tolerance Patient tolerated treatment well;Patient limited by pain;Patient limited by fatigue   Behavior During Therapy [facility] for tasks assessed/performed      No past medical history on file.  No past surgical history on file.  There were no vitals filed for this visit.  Visit Diagnosis:  Muscle weakness  Joint stiffness of knee, left  Difficulty walking  Left knee pain  Right ankle pain      Subjective Assessment - 09/15/15 0832    Subjective Pt. states she was not as sore after last tx. as previous.  Pt. ambulating short distances with use of rollator but requires scooter with all work/ community mobility.  R ankle discomfort and L knee pain remain with increase activity.  Pt. wearing compression stocking on R lower leg.     Limitations Lifting;Standing;Walking;House hold activities;Other (comment)   Patient Stated Goals Increase L knee ROM/strength and return to walking with appropriate assistive device safely.    Currently in Pain? Yes   Pain Score 3    Pain Location Ankle   Pain Orientation Right     LEFS: 30 out of 80 Sit to stand 5x: 13.8 sec./ 12.2 sec.    OBJECTIVE: There ex: Ambulation in the // bars with B UE support forwards with focus on increased knee flexion and heel strike. Side stepping in // bars with focus on equal weight bearing and increased step length. Gait around the gym with the rollator with cues for increased knee flexion.   3" step ups/ side step overs/ partial lunges with //-bar assist. Standing heel raises x 10.  Manual: in supine, stretching to L hamstrings and hip musculature. PA grade III mobilizations to femur and tibia to increase L knee flexion, 2 x 30 seconds. Streching to R gastroc in seated/LAQ position. Supine hip flexion/ ankle stretches/ supine L knee stretches (+1 to 105 deg.).  LLD with L shorter by 3 cm from umbilicus to medial malleoli as compared to R.  Neuro.:  Car transfer training with progression to managing rollator into/out of van while transferring into drivers seat.  Pt response to tx for medical necessity: Pt benefits from manual to progress AROM in L LE. Strengthening is beneficial in helping pt to return to prior level of function. Pt. Very motivated and focusing primarily on independent HEP due to financial issues with PT every 1-2 weeks as needed for progression.   No increase c/o pain after tx.        PT Education - 09/16/15 [phone]    Education provided Yes   Education Details Car transfer training with management of rollator (mod. I with minimal verbal cuing).     Person(s) Educated Patient   Methods Explanation;Demonstration   Comprehension Verbalized understanding;Returned demonstration             PT Long Term Goals - 09/15/15 0854    PT LONG TERM GOAL #1  Title Pt. I with HEP to increase L knee AROM flexion to >100 deg. to improve transfers/ walking with appropriate assistive device.    Baseline 105 deg. flexion in supine position   Time 4   Period Weeks   Status Achieved   PT LONG TERM GOAL #2   Title Pt. will increase LEFS to >40 out of 80 to improve overall functional mobility with household/ work-related tasks.    Baseline 30 out of 80 on 12/8 (good progress noted).    Time 6   Period Weeks   Status Not Met   PT LONG TERM GOAL #3   Title Pt. able to ambulate 100 feet with mod. I and RW on level surfaces at work to be able to go to copy room/ restroom  safely.    Baseline pt. has demonstrated good use of rollator on level surfaces   Time 6   Period Weeks   Status Partially Met   PT LONG TERM GOAL #4   Title Pt. able to ascend/descend 4 steps with proper step to gait pattern and 1 handrail safely.     Baseline pain limited with recip. pattern.   Time 8   Period Weeks   Status Partially Met            Plan - 09/16/15 0839    Clinical Impression Statement Pt. ambulates with improved hip/knee flexion with use of rollator with significant B UE assist for R ankle support/ balance/ safety.  PT attempted to instruct pt. in use of SPC on R with use of //-bar on L but not appropriate at this time due to antalgic gait pattern.  R ankle discomfort/ circulaton issues limit standing tolerance and progression to [facility] at this time.  No increase c/o L knee pain at this time.  L knee AROM 105 deg. (pain tolerable) in supine position.     Pt will benefit from skilled therapeutic intervention in order to improve on the following deficits Abnormal gait;Decreased strength;Improper body mechanics;Postural dysfunction;Difficulty walking;Decreased activity tolerance;Decreased mobility;Impaired flexibility;Obesity;Decreased range of motion;Hypermobility;Decreased balance;Pain;Hypomobility;Decreased endurance;Decreased scar mobility   Rehab Potential Good   PT Frequency 1x / week   PT Duration 8 weeks   PT Treatment/Interventions ADLs/Self Care Home Management;DME Instruction;Gait training;Stair training;Aquatic Therapy;Functional mobility training;Manual techniques;Cryotherapy;Electrical Stimulation;Moist Heat;Patient/family education;Neuromuscular re-education;Balance training;Therapeutic exercise;Therapeutic activities;Passive range of motion;Energy conservation   PT Next Visit Plan Increase ambulation/standing balance/core/re issue HEP   PT Home Exercise Plan core stability handouts   Consulted and Agree with Plan of Care Patient        Problem  List Patient Active Problem List   Diagnosis Date Noted  . PCOS (polycystic ovarian syndrome) 09/16/2015  . Blood glucose elevated 07/20/2015  . H/O pneumothorax 07/20/2015  . Pneumothorax 04/26/2014   Pura Spice, PT, DPT # [id number]   09/16/2015, 9:04 AM  [facility] [address] [place], [place], [address] Phone: [phone]   Fax:  [phone]  Name: Mariah Martin MRN: [id number] Date of Birth: 1976-08-25

## 2015-09-16 DIAGNOSIS — E282 Polycystic ovarian syndrome: Secondary | ICD-10-CM | POA: Insufficient documentation

## 2015-09-20 ENCOUNTER — Ambulatory Visit: Payer: BLUE CROSS/BLUE SHIELD | Admitting: Physical Therapy

## 2015-09-20 DIAGNOSIS — M25562 Pain in left knee: Secondary | ICD-10-CM

## 2015-09-20 DIAGNOSIS — M6281 Muscle weakness (generalized): Secondary | ICD-10-CM

## 2015-09-20 DIAGNOSIS — M25662 Stiffness of left knee, not elsewhere classified: Secondary | ICD-10-CM

## 2015-09-20 DIAGNOSIS — R262 Difficulty in walking, not elsewhere classified: Secondary | ICD-10-CM

## 2015-09-20 DIAGNOSIS — M25571 Pain in right ankle and joints of right foot: Secondary | ICD-10-CM

## 2015-09-21 ENCOUNTER — Encounter: Payer: Self-pay | Admitting: Physical Therapy

## 2015-09-21 NOTE — Therapy (Signed)
[facility] [address] [place], [place], [address] Phone: [phone]   Fax:  [phone]  Physical Therapy Treatment  Patient Details  Name: Mariah Martin MRN: [id number] Date of Birth: 1976/05/02 Referring Provider: Drue Dun  Encounter Date: 09/20/2015      PT End of Session - 09/20/15 1427    Visit Number 8   Number of Visits 16   Date for PT Re-Evaluation 09/28/15   PT Start Time 0726   PT Stop Time 0825   PT Time Calculation (min) 59 min   Activity Tolerance Patient tolerated treatment well;Patient limited by pain;Patient limited by fatigue   Behavior During Therapy [facility] for tasks assessed/performed      History reviewed. No pertinent past medical history.  History reviewed. No pertinent past surgical history.  There were no vitals filed for this visit.  Visit Diagnosis:  Muscle weakness  Joint stiffness of knee, left  Difficulty walking  Left knee pain  Right ankle pain      Subjective Assessment - 09/21/15 0820    Subjective Pt. reports she continues to work on standing/ walking with use of rollator.  Pt. planning on returning to pool in January and will start eating as if she had gastric wt. loss surgery.  No c/o pain at this moment but L knee/R ankle are still pain limiting joints.     Limitations Lifting;Standing;Walking;House hold activities;Other (comment)   Patient Stated Goals Increase L knee ROM/strength and return to walking with appropriate assistive device safely.    Currently in Pain? No/denies        OBJECTIVE: There. ex: Ambulation in the //-bars with B UE support forwards with focus on increased knee/hip flexion and heel strike. Side stepping in // bars with focus on equal weight bearing and increased step length over 3" plinths 5x.  3" step ups/ side step overs 10x L/R with UE assist (attempted with 1 UE but difficulty noted.   Manual tx.:  Supine PA grade III mobilizations to  femur and tibia to increase L knee flexion, 2 x 30 seconds. Streching to R gastroc in seated/LAQ position. Neuro.: Step ups on [place] at 3" and 6" step with B UE assist from //-bars.  Sit to stands from chair/ blue mat table working on Psychologist, occupational knee position.    Pt response to tx for medical necessity: Pt benefits from manual to progress AROM in L LE. Strengthening is beneficial in helping pt to return to prior level of function. Pt. Very motivated and focusing primarily on independent HEP due to financial issues with PT every 1-2 weeks as needed for progression. No increase c/o pain after tx.          PT Long Term Goals - 09/15/15 0854    PT LONG TERM GOAL #1   Title Pt. I with HEP to increase L knee AROM flexion to >100 deg. to improve transfers/ walking with appropriate assistive device.    Baseline 105 deg. flexion in supine position   Time 4   Period Weeks   Status Achieved   PT LONG TERM GOAL #2   Title Pt. will increase LEFS to >40 out of 80 to improve overall functional mobility with household/ work-related tasks.    Baseline 30 out of 80 on 12/8 (good progress noted).    Time 6   Period Weeks   Status Not Met   PT LONG TERM GOAL #3   Title Pt. able to ambulate  100 feet with mod. I and RW on level surfaces at work to be able to go to copy room/ restroom safely.    Baseline pt. has demonstrated good use of rollator on level surfaces   Time 6   Period Weeks   Status Partially Met   PT LONG TERM GOAL #4   Title Pt. able to ascend/descend 4 steps with proper step to gait pattern and 1 handrail safely.     Baseline pain limited with recip. pattern.   Time 8   Period Weeks   Status Partially Met           Plan - 09/21/15 1427    Clinical Impression Statement Pt. works really hard during tx. session with standing/ walking/ strengthening of L quad/ lower leg stability.  Pt. able to safely step up/down 6 inch step but limited by R ankle/L knee  discomfort.  Pt. benefits from use of B UE with standing/walking tasks for safety/ stability.  Antalgic gait pattern noted in //-bars and with use of rollator.  Pt. educated on safety of using rollator on vinyl/ tile surfaces.     Pt will benefit from skilled therapeutic intervention in order to improve on the following deficits Abnormal gait;Decreased strength;Improper body mechanics;Postural dysfunction;Difficulty walking;Decreased activity tolerance;Decreased mobility;Impaired flexibility;Obesity;Decreased range of motion;Hypermobility;Decreased balance;Pain;Hypomobility;Decreased endurance;Decreased scar mobility   Rehab Potential Good   PT Frequency 1x / week   PT Duration 8 weeks   PT Treatment/Interventions ADLs/Self Care Home Management;DME Instruction;Gait training;Stair training;Aquatic Therapy;Functional mobility training;Manual techniques;Cryotherapy;Electrical Stimulation;Moist Heat;Patient/family education;Neuromuscular re-education;Balance training;Therapeutic exercise;Therapeutic activities;Passive range of motion;Energy conservation   PT Next Visit Plan Increase ambulation/standing balance/core progression   Consulted and Agree with Plan of Care Patient        Problem List Patient Active Problem List   Diagnosis Date Noted  . PCOS (polycystic ovarian syndrome) 09/16/2015  . Blood glucose elevated 07/20/2015  . H/O pneumothorax 07/20/2015  . Pneumothorax 04/26/2014   Pura Spice, PT, DPT # [phone]   09/21/2015, 3:09 PM  [place] [facility] [address] [place], [place], [address] Phone: [phone]   Fax:  [phone]  Name: CHRISTEEN LAI MRN: [id number] Date of Birth: 10/06/1976

## 2015-09-27 ENCOUNTER — Ambulatory Visit: Payer: BLUE CROSS/BLUE SHIELD | Admitting: Physical Therapy

## 2015-09-27 DIAGNOSIS — M25562 Pain in left knee: Secondary | ICD-10-CM

## 2015-09-27 DIAGNOSIS — R262 Difficulty in walking, not elsewhere classified: Secondary | ICD-10-CM

## 2015-09-27 DIAGNOSIS — M25662 Stiffness of left knee, not elsewhere classified: Secondary | ICD-10-CM

## 2015-09-27 DIAGNOSIS — M6281 Muscle weakness (generalized): Secondary | ICD-10-CM

## 2015-09-27 DIAGNOSIS — M25571 Pain in right ankle and joints of right foot: Secondary | ICD-10-CM

## 2015-09-28 NOTE — Therapy (Signed)
[facility] [address] [place], [place], [address] Phone: [phone]   Fax:  [phone]  Physical Therapy Treatment  Patient Details  Name: Mariah Martin MRN: [id number] Date of Birth: 1975/11/04 Referring Provider: Drue Dun  Encounter Date: 09/27/2015      PT End of Session - 09/28/15 0950    Visit Number 9   Number of Visits 17   Date for PT Re-Evaluation 11/22/15   PT Start Time 0714   PT Stop Time 0832   PT Time Calculation (min) 78 min   Activity Tolerance Patient tolerated treatment well;Patient limited by pain;Patient limited by fatigue   Behavior During Therapy [facility] for tasks assessed/performed      No past medical history on file.  No past surgical history on file.  There were no vitals filed for this visit.  Visit Diagnosis:  Muscle weakness  Joint stiffness of knee, left  Difficulty walking  Left knee pain  Right ankle pain      Subjective Assessment - 09/27/15 1308    Subjective Pt. states she has started walking with SPC/QC at home short distances and even demonstrates walking with no assistive device.  Pt. reports she is doing pretty well today and not too sore.     Limitations Lifting;Standing;Walking;House hold activities;Other (comment)   Patient Stated Goals Increase L knee ROM/strength and return to walking with appropriate assistive device safely.    Currently in Pain? No/denies      OBJECTIVE: Gait training: Ambulation from van to PT gym with use of rollator and progressing to QC working on 2-3 point gait pattern/ upright posture/ heel strike/ L knee flexion.  SBA with verbal cuing for safety/ proper technique/ posture.  Amb in //-bars with B UE support forwards with focus on increased knee/hip flexion and heel strike. Side stepping in // bars with focus on equal weight bearing and increased step length over 3" plinths 5x.  There.ex.: 3" and 6" step ups/ side step overs 10x L/R  with UE assist (B UE assist to 1 UE for safety, esp. With L LE step ups).  Supine/ standing 2.5# ankle wts. (hip flexion/ LAQ/ SAQ with no bolster)- 20x each. Sit to stands from chair/ blue mat table working on Psychologist, occupational knee position.  Manual tx.: Supine PA grade III mobilizations to femur and tibia to increase L knee flexion, 2 x 30 seconds. Stretching to R gastroc in seated/LAQ position.   Pt response to tx for medical necessity: Pt benefits from manual to progress AROM in L LE. Strengthening is beneficial in helping pt to return to prior level of function. Pt. Very motivated and focusing primarily on independent HEP due to financial issues with PT every 1-2 weeks as needed for progression. No increase c/o pain after tx. And able to ambulate for 1st time with use of QC on level surfaces short distances.           PT Education - 09/27/15 1312    Education provided Yes   Education Details Use of SPC or QC walking on level surfaces in clinic.     Person(s) Educated Patient   Methods Explanation;Demonstration;Verbal cues   Comprehension Verbalized understanding;Returned demonstration            PT Long Term Goals - 09/28/15 0956    PT LONG TERM GOAL #1   Title Pt. I with HEP to increase L knee AROM flexion to >100 deg. to improve transfers/ walking with appropriate  assistive device.    Baseline 105 deg. flexion in supine position   Time 4   Period Weeks   Status Achieved   PT LONG TERM GOAL #2   Title Pt. will increase LEFS to >40 out of 80 to improve overall functional mobility with household/ work-related tasks.    Baseline 30 out of 80 on 12/8 (good progress noted).    Time 8   Period Weeks   Status Not Met   PT LONG TERM GOAL #3   Title Pt. able to ambulate 100 feet with mod. I and RW on level surfaces at work to be able to go to copy room/ restroom safely.    Baseline pt. has demonstrated good use of rollator on level surfaces   Time 8   Period  Weeks   Status Partially Met   PT LONG TERM GOAL #4   Title Pt. able to ascend/descend 4 steps with proper step to gait pattern and 1 handrail safely.     Baseline pain limited with recip. pattern.   Time 8   Period Weeks   Status Partially Met   PT LONG TERM GOAL #5   Title Pt. will increase L hip/knee strength to 4+/5 MMT to improve standing tolerance/ endurance with gait pattern.     Time 8   Period Weeks   Status On-going   PT LONG TERM GOAL #6   Title Pt. able to ambulate community distances with least assistive device with mod. I safely on level surfaces to improve overall mobility.     Time 8   Period Weeks   Status On-going            Plan - 09/28/15 0951    Clinical Impression Statement Pt. has progressed to safely ambulating short distances in PT clinic with use of QC and 2-3 point gait pattern with slight L antalgic gait pattern/ LLD noted.  Pt. reports minimal c/o L knee/R ankle pain with standing and walking tasks during tx. today.  Pt. benefit from use of scooter for community based mobility but progressing from use of RW to safe cane use for home/ work ambulation.  Increase R ankle PF/DF to [facility] during stretching as compared to L ankle.  Pain limited L knee/ distal quad with step ups on L and unable to safely step off 6" step without significant assist from B UE.  Pt. is going out of town for >1 week and will not return to PT until first week of January 2017.  Pt. educated on importance of ther.ex/ walking with appropriate assistive device during vacation to promote strength/ progress towards goals.      Pt will benefit from skilled therapeutic intervention in order to improve on the following deficits Abnormal gait;Decreased strength;Improper body mechanics;Postural dysfunction;Difficulty walking;Decreased activity tolerance;Decreased mobility;Impaired flexibility;Obesity;Decreased range of motion;Hypermobility;Decreased balance;Pain;Hypomobility;Decreased  endurance;Decreased scar mobility   Rehab Potential Good   PT Frequency 1x / week   PT Duration 8 weeks   PT Treatment/Interventions ADLs/Self Care Home Management;DME Instruction;Gait training;Stair training;Aquatic Therapy;Functional mobility training;Manual techniques;Cryotherapy;Electrical Stimulation;Moist Heat;Patient/family education;Neuromuscular re-education;Balance training;Therapeutic exercise;Therapeutic activities;Passive range of motion;Energy conservation   PT Next Visit Plan Increase ambulation/standing balance/core progression.  Discuss trip to [place]/ reassess gait.     PT Home Exercise Plan core stability handouts   Consulted and Agree with Plan of Care Patient        Problem List Patient Active Problem List   Diagnosis Date Noted  . PCOS (polycystic ovarian syndrome) 09/16/2015  .  Blood glucose elevated 07/20/2015  . H/O pneumothorax 07/20/2015  . Pneumothorax 04/26/2014   Pura Spice, PT, DPT # [phone]   09/28/2015, 9:58 AM  [place] [facility] [address] [place], [place], [address] Phone: [phone]   Fax:  [phone]  Name: Mariah Martin MRN: [id number] Date of Birth: 1975/12/20

## 2015-10-12 ENCOUNTER — Ambulatory Visit: Payer: BLUE CROSS/BLUE SHIELD | Attending: Orthopedic Surgery | Admitting: Physical Therapy

## 2015-10-12 DIAGNOSIS — M25571 Pain in right ankle and joints of right foot: Secondary | ICD-10-CM | POA: Insufficient documentation

## 2015-10-12 DIAGNOSIS — M6281 Muscle weakness (generalized): Secondary | ICD-10-CM | POA: Diagnosis present

## 2015-10-12 DIAGNOSIS — R262 Difficulty in walking, not elsewhere classified: Secondary | ICD-10-CM | POA: Insufficient documentation

## 2015-10-12 DIAGNOSIS — M25662 Stiffness of left knee, not elsewhere classified: Secondary | ICD-10-CM | POA: Diagnosis present

## 2015-10-12 DIAGNOSIS — M25562 Pain in left knee: Secondary | ICD-10-CM | POA: Diagnosis present

## 2015-10-13 NOTE — Therapy (Signed)
[place] [facility] [address] [place], [place], [address] Phone: [phone]   Fax:  [phone]  Physical Therapy Treatment  Patient Details  Name: Mariah Martin MRN: [id number] Date of Birth: 07/25/1976 Referring Provider: Drue Dun  Encounter Date: 10/12/2015      PT End of Session - 10/13/15 1716    Visit Number 10   Number of Visits 17   Date for PT Re-Evaluation 11/22/15   PT Start Time 0724   PT Stop Time 0832   PT Time Calculation (min) 68 min   Activity Tolerance Patient tolerated treatment well;Patient limited by pain;Patient limited by fatigue   Behavior During Therapy [facility] for tasks assessed/performed      No past medical history on file.  No past surgical history on file.  There were no vitals filed for this visit.  Visit Diagnosis:  Muscle weakness  Joint stiffness of knee, left  Difficulty walking  Left knee pain  Right ankle pain      OBJECTIVE: Gait training: Ambulation from van to PT gym with use of rollator and progressing to QC working on 2-3 point gait pattern/ upright posture/ heel strike/ L knee flexion. SBA with verbal cuing for safety/ proper technique/ posture. Amb in //-bars with B UE support forwards with focus on increased knee/hip flexion and heel strike. Side stepping in // bars with focus on equal weight bearing and increased step length over 3" plinths 5x. There.ex.: 3" and 6" step ups/ side step overs 10x L/R with UE assist (B UE assist to 1 UE for safety, esp. With L LE step ups). Supine/ standing 2.5# ankle wts. (hip flexion/ LAQ/ SAQ with no bolster)- 20x each. Sit to stands from chair/ blue mat table working on Psychologist, occupational knee position.  Manual tx.: Supine PA grade III mobilizations to femur and tibia to increase L knee flexion, 2 x 30 seconds. Stretching to R gastroc in seated/LAQ position.   Pt response to tx for medical necessity: Pt benefits from  manual to progress AROM in L LE. Strengthening is beneficial in helping pt to return to prior level of function. Pt. Very motivated and focusing primarily on independent HEP due to financial issues with PT every 1-2 weeks as needed for progression. No increase c/o pain after tx. And able to ambulate for 1st time with use of QC on level surfaces short distances.            PT Long Term Goals - 09/28/15 0956    PT LONG TERM GOAL #1   Title Pt. I with HEP to increase L knee AROM flexion to >100 deg. to improve transfers/ walking with appropriate assistive device.    Baseline 105 deg. flexion in supine position   Time 4   Period Weeks   Status Achieved   PT LONG TERM GOAL #2   Title Pt. will increase LEFS to >40 out of 80 to improve overall functional mobility with household/ work-related tasks.    Baseline 30 out of 80 on 12/8 (good progress noted).    Time 8   Period Weeks   Status Not Met   PT LONG TERM GOAL #3   Title Pt. able to ambulate 100 feet with mod. I and RW on level surfaces at work to be able to go to copy room/ restroom safely.    Baseline pt. has demonstrated good use of rollator on level surfaces   Time 8   Period Weeks  Status Partially Met   PT LONG TERM GOAL #4   Title Pt. able to ascend/descend 4 steps with proper step to gait pattern and 1 handrail safely.     Baseline pain limited with recip. pattern.   Time 8   Period Weeks   Status Partially Met   PT LONG TERM GOAL #5   Title Pt. will increase L hip/knee strength to 4+/5 MMT to improve standing tolerance/ endurance with gait pattern.     Time 8   Period Weeks   Status On-going   PT LONG TERM GOAL #6   Title Pt. able to ambulate community distances with least assistive device with mod. I safely on level surfaces to improve overall mobility.     Time 8   Period Weeks   Status On-going               Problem List Patient Active Problem List   Diagnosis Date Noted  . PCOS (polycystic  ovarian syndrome) 09/16/2015  . Blood glucose elevated 07/20/2015  . H/O pneumothorax 07/20/2015  . Pneumothorax 04/26/2014    Pura Spice 10/13/2015, 5:18 PM  [place] [facility] [address] [place], [place], [address] Phone: [phone]   Fax:  [phone]  Name: Mariah Martin MRN: [id number] Date of Birth: 1975/12/02

## 2015-11-02 ENCOUNTER — Ambulatory Visit: Payer: BLUE CROSS/BLUE SHIELD | Admitting: Physical Therapy

## 2015-11-02 DIAGNOSIS — M6281 Muscle weakness (generalized): Secondary | ICD-10-CM | POA: Diagnosis not present

## 2015-11-02 DIAGNOSIS — M25662 Stiffness of left knee, not elsewhere classified: Secondary | ICD-10-CM

## 2015-11-02 DIAGNOSIS — M25562 Pain in left knee: Secondary | ICD-10-CM

## 2015-11-02 DIAGNOSIS — R262 Difficulty in walking, not elsewhere classified: Secondary | ICD-10-CM

## 2015-11-02 DIAGNOSIS — M25571 Pain in right ankle and joints of right foot: Secondary | ICD-10-CM

## 2015-11-03 NOTE — Therapy (Signed)
[place] [address] [place], [place], [address] Phone: [phone]   Fax:  [phone]  Physical Therapy Treatment  Patient Details  Name: Mariah Martin MRN: [id number] Date of Birth: 20-Jan-1976 Referring Provider: Drue Dun  Encounter Date: 11/02/2015      PT End of Session - 11/03/15 1259    Visit Number 11   Number of Visits 17   Date for PT Re-Evaluation 11/22/15   PT Start Time 0727   PT Stop Time 0842   PT Time Calculation (min) 75 min   Equipment Utilized During Treatment Gait belt   Activity Tolerance Patient tolerated treatment well;Patient limited by pain;Patient limited by fatigue   Behavior During Therapy [facility] for tasks assessed/performed      No past medical history on file.  No past surgical history on file.  There were no vitals filed for this visit.  Visit Diagnosis:  Muscle weakness  Joint stiffness of knee, left  Difficulty walking  Left knee pain  Right ankle pain      Subjective Assessment - 11/03/15 1249    Subjective Pt. used rollator with walking over entire weekend except when she went to store she required use of scooter.  Pt. reported increase L thigh/ knee discomfort for several days after active weekend but doing better today.  Pt. has good walking program/ plan in effect to ambulate more and use scooter less at work/ home.       Limitations Lifting;Standing;Walking;House hold activities;Other (comment)   Patient Stated Goals Increase L knee ROM/strength and return to walking with appropriate assistive device safely.    Currently in Pain? No/denies      OBJECTIVE: Gait training: Ambulation in PT gym with use of [place] working on 2 point gait pattern/ upright posture/ heel strike/ L knee flexion. SBA with verbal cuing for safety/ proper technique/ posture. Amb in //-bars with B UE support forwards with focus on increased knee/hip flexion and heel strike. Side stepping in //  bars with focus on equal weight bearing and increased step length over 3" plinths 5x. There.ex.: 3" and 6" step ups/ side step overs 10x L/R with UE assist (B UE assist to 1 UE for safety, esp. With L LE step ups). Seated hip flexion/ LAQ/ supine SAQ with no bolster and manual resistance)- 20x each. Sit to stands from chair/ blue mat table working on Psychologist, occupational knee position.  Manual tx.: Supine PA grade III mobilizations to femur and tibia to increase L knee flexion, 2 x 30 seconds. Stretching to R gastroc in seated/LAQ position with assessment of R ankle subtalar mobility.  .   Pt response to tx for medical necessity: Pt benefits from manual therapy to progress AROM in L LE/ R ankle. Strengthening is beneficial in helping pt to return to prior level of function. Pt. Very motivated and focusing primarily on independent HEP due to financial issues with PT every 2-3 weeks as needed for progression. No increase c/o pain after tx. And ambulating short distance with use of SPC and improved technique/ gait pattern.         PT Long Term Goals - 11/03/15 1327    PT LONG TERM GOAL #1   Title Pt. I with HEP to increase L knee AROM flexion to >100 deg. to improve transfers/ walking with appropriate assistive device.    Baseline 105 deg. flexion in supine position   Time 4   Period Weeks   Status Achieved  PT LONG TERM GOAL #2   Title Pt. will increase LEFS to >40 out of 80 to improve overall functional mobility with household/ work-related tasks.    Baseline 30 out of 80 on 12/8 (good progress noted).    Time 8   Period Weeks   Status On-going   PT LONG TERM GOAL #3   Title Pt. able to ambulate 100 feet with mod. I and RW on level surfaces at work to be able to go to copy room/ restroom safely.    Baseline using rollator at home/ work with rest breaks PRN   Time 8   Period Weeks   Status Partially Met   PT LONG TERM GOAL #4   Title Pt. able to ascend/descend 4 steps  with proper step to gait pattern and 1 handrail safely.     Baseline pain limited with recip. pattern.   Time 8   Period Weeks   Status Partially Met   PT LONG TERM GOAL #5   Title Pt. will increase L hip/knee strength to 4+/5 MMT to improve standing tolerance/ endurance with gait pattern.     Time 8   Period Weeks   Status Partially Met   PT LONG TERM GOAL #6   Title Pt. able to ambulate community distances with least assistive device with mod. I safely on level surfaces to improve overall mobility.     Time 8   Period Weeks   Status Partially Met            Plan - 11/03/15 1300    Clinical Impression Statement Pt. continues to demonstrate improved mobility with use of rollator and has progressed to use of SPC for short distance (<150 feet) ambulation on level surfaces.  Pt. has significant R knee crepitus with active flexion/ MMT and limited R ankle ROM with open chain ex.  Pt. ambulates with limited heel strike/toe off on R with frequent lateral swaying to compensate.  Increasing L knee discomfort with prolonged standing/walking tasks.  Marked increase in L knee AROM >105 deg. in pain tolerable active range.  Pt. will benefit from continue PT on a limited with primary focus on gym based/ HEP.     Pt will benefit from skilled therapeutic intervention in order to improve on the following deficits Abnormal gait;Decreased strength;Improper body mechanics;Postural dysfunction;Difficulty walking;Decreased activity tolerance;Decreased mobility;Impaired flexibility;Obesity;Decreased range of motion;Hypermobility;Decreased balance;Pain;Hypomobility;Decreased endurance;Decreased scar mobility   Rehab Potential Good   PT Frequency 1x / week   PT Duration 8 weeks   PT Treatment/Interventions ADLs/Self Care Home Management;DME Instruction;Gait training;Stair training;Aquatic Therapy;Functional mobility training;Manual techniques;Cryotherapy;Electrical Stimulation;Moist Heat;Patient/family  education;Neuromuscular re-education;Balance training;Therapeutic exercise;Therapeutic activities;Passive range of motion;Energy conservation   PT Next Visit Plan Progress LE strengthening/ independence with gait with most appropriate assistive device.    PT Home Exercise Plan core stability handouts.  Using Nustep 3x/week at [facility] and Agree with Plan of Care Patient        Problem List Patient Active Problem List   Diagnosis Date Noted  . PCOS (polycystic ovarian syndrome) 09/16/2015  . Blood glucose elevated 07/20/2015  . H/O pneumothorax 07/20/2015  . Pneumothorax 04/26/2014   Pura Spice, PT, DPT # [phone]   11/03/2015, 5:29 PM  [place] [facility] [address] [place], [place], [address] Phone: [phone]   Fax:  [phone]  Name: Mariah Martin MRN: [id number] Date of Birth: 1976-06-23

## 2015-12-13 ENCOUNTER — Other Ambulatory Visit: Payer: Self-pay | Admitting: Family Medicine

## 2015-12-13 DIAGNOSIS — R739 Hyperglycemia, unspecified: Secondary | ICD-10-CM

## 2015-12-28 ENCOUNTER — Ambulatory Visit: Payer: BLUE CROSS/BLUE SHIELD | Attending: Orthopedic Surgery | Admitting: Physical Therapy

## 2015-12-28 DIAGNOSIS — M6281 Muscle weakness (generalized): Secondary | ICD-10-CM | POA: Insufficient documentation

## 2015-12-28 DIAGNOSIS — M25571 Pain in right ankle and joints of right foot: Secondary | ICD-10-CM | POA: Diagnosis present

## 2015-12-28 DIAGNOSIS — M25662 Stiffness of left knee, not elsewhere classified: Secondary | ICD-10-CM | POA: Insufficient documentation

## 2015-12-28 DIAGNOSIS — M25562 Pain in left knee: Secondary | ICD-10-CM | POA: Insufficient documentation

## 2015-12-28 DIAGNOSIS — R262 Difficulty in walking, not elsewhere classified: Secondary | ICD-10-CM | POA: Insufficient documentation

## 2015-12-29 NOTE — Therapy (Signed)
[facility] [address] [place], [place], [address] Phone: [phone]   Fax:  [phone]  Physical Therapy Treatment  Patient Details  Name: Mariah Martin MRN: [id number] Date of Birth: 27-Feb-1976 Referring Provider: Drue Dun  Encounter Date: 12/28/2015      PT End of Session - 12/29/15 1945    Visit Number 12   Number of Visits 15   Date for PT Re-Evaluation 02/22/16   PT Start Time 0741   PT Stop Time 3818   PT Time Calculation (min) 72 min   Activity Tolerance Patient tolerated treatment well;Patient limited by pain;Patient limited by fatigue      No past medical history on file.  No past surgical history on file.  There were no vitals filed for this visit.  Visit Diagnosis:  Muscle weakness  Joint stiffness of knee, left  Difficulty walking  Left knee pain  Right ankle pain                                    PT Long Term Goals - 11/03/15 1327    PT LONG TERM GOAL #1   Title Pt. I with HEP to increase L knee AROM flexion to >100 deg. to improve transfers/ walking with appropriate assistive device.    Baseline 105 deg. flexion in supine position   Time 4   Period Weeks   Status Achieved   PT LONG TERM GOAL #2   Title Pt. will increase LEFS to >40 out of 80 to improve overall functional mobility with household/ work-related tasks.    Baseline 30 out of 80 on 12/8 (good progress noted).    Time 8   Period Weeks   Status On-going   PT LONG TERM GOAL #3   Title Pt. able to ambulate 100 feet with mod. I and RW on level surfaces at work to be able to go to copy room/ restroom safely.    Baseline using rollator at home/ work with rest breaks PRN   Time 8   Period Weeks   Status Partially Met   PT LONG TERM GOAL #4   Title Pt. able to ascend/descend 4 steps with proper step to gait pattern and 1 handrail safely.     Baseline pain limited with recip. pattern.   Time 8    Period Weeks   Status Partially Met   PT LONG TERM GOAL #5   Title Pt. will increase L hip/knee strength to 4+/5 MMT to improve standing tolerance/ endurance with gait pattern.     Time 8   Period Weeks   Status Partially Met   PT LONG TERM GOAL #6   Title Pt. able to ambulate community distances with least assistive device with mod. I safely on level surfaces to improve overall mobility.     Time 8   Period Weeks   Status Partially Met               Problem List Patient Active Problem List   Diagnosis Date Noted  . PCOS (polycystic ovarian syndrome) 09/16/2015  . Blood glucose elevated 07/20/2015  . H/O pneumothorax 07/20/2015  . Pneumothorax 04/26/2014   Pura Spice, PT, DPT # [phone]   12/29/2015, 7:49 PM  [place] [address] [place], [place], [address] Phone: [phone]   Fax:  [phone]  Name: Mariah Martin MRN: [id number] Date of Birth: April 14, 1976

## 2016-01-11 DIAGNOSIS — S72402K Unspecified fracture of lower end of left femur, subsequent encounter for closed fracture with nonunion: Secondary | ICD-10-CM | POA: Diagnosis not present

## 2016-01-11 DIAGNOSIS — E119 Type 2 diabetes mellitus without complications: Secondary | ICD-10-CM | POA: Diagnosis not present

## 2016-01-11 DIAGNOSIS — I10 Essential (primary) hypertension: Secondary | ICD-10-CM | POA: Diagnosis not present

## 2016-01-11 DIAGNOSIS — S7292XK Unspecified fracture of left femur, subsequent encounter for closed fracture with nonunion: Secondary | ICD-10-CM | POA: Diagnosis not present

## 2016-01-11 HISTORY — PX: FRACTURE SURGERY: SHX138

## 2016-01-12 DIAGNOSIS — E119 Type 2 diabetes mellitus without complications: Secondary | ICD-10-CM | POA: Diagnosis not present

## 2016-01-12 DIAGNOSIS — S72402K Unspecified fracture of lower end of left femur, subsequent encounter for closed fracture with nonunion: Secondary | ICD-10-CM | POA: Diagnosis not present

## 2016-01-12 DIAGNOSIS — I10 Essential (primary) hypertension: Secondary | ICD-10-CM | POA: Diagnosis not present

## 2016-01-18 ENCOUNTER — Telehealth: Payer: Self-pay

## 2016-01-18 NOTE — Telephone Encounter (Signed)
Lmtcb, paper/forms on desk 221. We received a fax from [facility] stating patient is in a diabetes program management and they are requesting lab results on patient and we need to make sure patient is in this program and it is not a scam or something-aa

## 2016-01-18 NOTE — Telephone Encounter (Signed)
Pt advised and she states she is in the program, information filled out and faxed over-aa

## 2016-01-26 DIAGNOSIS — S72352D Displaced comminuted fracture of shaft of left femur, subsequent encounter for closed fracture with routine healing: Secondary | ICD-10-CM | POA: Diagnosis not present

## 2016-01-26 DIAGNOSIS — S7222XD Displaced subtrochanteric fracture of left femur, subsequent encounter for closed fracture with routine healing: Secondary | ICD-10-CM | POA: Diagnosis not present

## 2016-01-30 ENCOUNTER — Encounter: Payer: Self-pay | Admitting: Family Medicine

## 2016-01-30 ENCOUNTER — Ambulatory Visit (INDEPENDENT_AMBULATORY_CARE_PROVIDER_SITE_OTHER): Payer: BLUE CROSS/BLUE SHIELD | Admitting: Family Medicine

## 2016-01-30 VITALS — BP 110/80 | HR 76 | Temp 98.3°F | Resp 16

## 2016-01-30 DIAGNOSIS — I10 Essential (primary) hypertension: Secondary | ICD-10-CM | POA: Insufficient documentation

## 2016-01-30 DIAGNOSIS — S7290XA Unspecified fracture of unspecified femur, initial encounter for closed fracture: Secondary | ICD-10-CM

## 2016-01-30 DIAGNOSIS — S82899A Other fracture of unspecified lower leg, initial encounter for closed fracture: Secondary | ICD-10-CM

## 2016-01-30 DIAGNOSIS — E039 Hypothyroidism, unspecified: Secondary | ICD-10-CM

## 2016-01-30 DIAGNOSIS — D219 Benign neoplasm of connective and other soft tissue, unspecified: Secondary | ICD-10-CM | POA: Insufficient documentation

## 2016-01-30 DIAGNOSIS — E041 Nontoxic single thyroid nodule: Secondary | ICD-10-CM

## 2016-01-30 DIAGNOSIS — D649 Anemia, unspecified: Secondary | ICD-10-CM | POA: Insufficient documentation

## 2016-01-30 DIAGNOSIS — G902 Horner's syndrome: Secondary | ICD-10-CM

## 2016-01-30 DIAGNOSIS — R1902 Left upper quadrant abdominal swelling, mass and lump: Secondary | ICD-10-CM | POA: Insufficient documentation

## 2016-01-30 DIAGNOSIS — N644 Mastodynia: Secondary | ICD-10-CM | POA: Insufficient documentation

## 2016-01-30 DIAGNOSIS — S82009A Unspecified fracture of unspecified patella, initial encounter for closed fracture: Secondary | ICD-10-CM

## 2016-01-30 DIAGNOSIS — B351 Tinea unguium: Secondary | ICD-10-CM | POA: Insufficient documentation

## 2016-01-30 DIAGNOSIS — E038 Other specified hypothyroidism: Secondary | ICD-10-CM

## 2016-01-30 DIAGNOSIS — R748 Abnormal levels of other serum enzymes: Secondary | ICD-10-CM | POA: Insufficient documentation

## 2016-01-30 DIAGNOSIS — F419 Anxiety disorder, unspecified: Secondary | ICD-10-CM | POA: Insufficient documentation

## 2016-01-30 DIAGNOSIS — K2970 Gastritis, unspecified, without bleeding: Secondary | ICD-10-CM

## 2016-01-30 DIAGNOSIS — E559 Vitamin D deficiency, unspecified: Secondary | ICD-10-CM | POA: Insufficient documentation

## 2016-01-30 DIAGNOSIS — K219 Gastro-esophageal reflux disease without esophagitis: Secondary | ICD-10-CM | POA: Insufficient documentation

## 2016-01-30 DIAGNOSIS — E538 Deficiency of other specified B group vitamins: Secondary | ICD-10-CM | POA: Insufficient documentation

## 2016-01-30 HISTORY — DX: Other fracture of unspecified lower leg, initial encounter for closed fracture: S82.899A

## 2016-01-30 HISTORY — DX: Tinea unguium: B35.1

## 2016-01-30 HISTORY — DX: Horner's syndrome: G90.2

## 2016-01-30 HISTORY — DX: Unspecified fracture of unspecified femur, initial encounter for closed fracture: S72.90XA

## 2016-01-30 HISTORY — DX: Unspecified fracture of unspecified patella, initial encounter for closed fracture: S82.009A
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
## 2016-02-16 DIAGNOSIS — E042 Nontoxic multinodular goiter: Secondary | ICD-10-CM | POA: Diagnosis not present

## 2016-02-16 DIAGNOSIS — R7303 Prediabetes: Secondary | ICD-10-CM | POA: Diagnosis not present

## 2016-02-16 DIAGNOSIS — M791 Myalgia: Secondary | ICD-10-CM | POA: Diagnosis not present

## 2016-02-16 DIAGNOSIS — R946 Abnormal results of thyroid function studies: Secondary | ICD-10-CM | POA: Diagnosis not present

## 2016-02-16 DIAGNOSIS — Z8639 Personal history of other endocrine, nutritional and metabolic disease: Secondary | ICD-10-CM | POA: Diagnosis not present

## 2016-02-21 DIAGNOSIS — D485 Neoplasm of uncertain behavior of skin: Secondary | ICD-10-CM | POA: Diagnosis not present

## 2016-02-21 DIAGNOSIS — L812 Freckles: Secondary | ICD-10-CM | POA: Diagnosis not present

## 2016-02-21 DIAGNOSIS — E119 Type 2 diabetes mellitus without complications: Secondary | ICD-10-CM | POA: Diagnosis not present

## 2016-02-21 DIAGNOSIS — L720 Epidermal cyst: Secondary | ICD-10-CM | POA: Diagnosis not present

## 2016-02-21 DIAGNOSIS — D2261 Melanocytic nevi of right upper limb, including shoulder: Secondary | ICD-10-CM | POA: Diagnosis not present

## 2016-02-21 DIAGNOSIS — D225 Melanocytic nevi of trunk: Secondary | ICD-10-CM | POA: Diagnosis not present

## 2016-02-21 DIAGNOSIS — D229 Melanocytic nevi, unspecified: Secondary | ICD-10-CM | POA: Diagnosis not present

## 2016-02-23 DIAGNOSIS — S72402D Unspecified fracture of lower end of left femur, subsequent encounter for closed fracture with routine healing: Secondary | ICD-10-CM | POA: Diagnosis not present

## 2016-02-23 DIAGNOSIS — S72352D Displaced comminuted fracture of shaft of left femur, subsequent encounter for closed fracture with routine healing: Secondary | ICD-10-CM | POA: Diagnosis not present

## 2016-02-23 DIAGNOSIS — Z967 Presence of other bone and tendon implants: Secondary | ICD-10-CM | POA: Diagnosis not present

## 2016-03-27 ENCOUNTER — Other Ambulatory Visit: Payer: Self-pay | Admitting: Family Medicine

## 2016-03-27 DIAGNOSIS — E282 Polycystic ovarian syndrome: Secondary | ICD-10-CM

## 2016-04-05 DIAGNOSIS — S72402D Unspecified fracture of lower end of left femur, subsequent encounter for closed fracture with routine healing: Secondary | ICD-10-CM | POA: Diagnosis not present

## 2016-04-05 DIAGNOSIS — S72402A Unspecified fracture of lower end of left femur, initial encounter for closed fracture: Secondary | ICD-10-CM | POA: Diagnosis not present

## 2016-04-05 DIAGNOSIS — Z967 Presence of other bone and tendon implants: Secondary | ICD-10-CM | POA: Diagnosis not present

## 2016-04-18 DIAGNOSIS — T84115D Breakdown (mechanical) of internal fixation device of left femur, subsequent encounter: Secondary | ICD-10-CM | POA: Diagnosis not present

## 2016-04-18 DIAGNOSIS — Z1810 Retained metal fragments, unspecified: Secondary | ICD-10-CM | POA: Diagnosis not present

## 2016-04-18 DIAGNOSIS — M25552 Pain in left hip: Secondary | ICD-10-CM | POA: Diagnosis not present

## 2016-04-18 DIAGNOSIS — S72402D Unspecified fracture of lower end of left femur, subsequent encounter for closed fracture with routine healing: Secondary | ICD-10-CM | POA: Diagnosis not present

## 2016-04-18 DIAGNOSIS — M898X5 Other specified disorders of bone, thigh: Secondary | ICD-10-CM | POA: Diagnosis not present

## 2016-04-18 DIAGNOSIS — M21852 Other specified acquired deformities of left thigh: Secondary | ICD-10-CM | POA: Diagnosis not present

## 2016-05-03 DIAGNOSIS — S72402K Unspecified fracture of lower end of left femur, subsequent encounter for closed fracture with nonunion: Secondary | ICD-10-CM | POA: Diagnosis not present

## 2016-06-14 DIAGNOSIS — S72402K Unspecified fracture of lower end of left femur, subsequent encounter for closed fracture with nonunion: Secondary | ICD-10-CM | POA: Diagnosis not present

## 2016-06-14 DIAGNOSIS — Z9889 Other specified postprocedural states: Secondary | ICD-10-CM | POA: Diagnosis not present

## 2016-06-25 ENCOUNTER — Ambulatory Visit (INDEPENDENT_AMBULATORY_CARE_PROVIDER_SITE_OTHER): Payer: BLUE CROSS/BLUE SHIELD | Admitting: Family Medicine

## 2016-06-25 ENCOUNTER — Encounter: Payer: Self-pay | Admitting: Family Medicine

## 2016-06-25 VITALS — BP 112/72 | HR 97 | Temp 98.4°F | Resp 16 | Wt 320.0 lb

## 2016-06-25 DIAGNOSIS — R739 Hyperglycemia, unspecified: Secondary | ICD-10-CM | POA: Diagnosis not present

## 2016-06-25 DIAGNOSIS — M546 Pain in thoracic spine: Secondary | ICD-10-CM | POA: Diagnosis not present
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
## 2016-06-25 NOTE — Patient Instructions (Signed)
We will call you with the lab results.

## 2016-06-26 DIAGNOSIS — R739 Hyperglycemia, unspecified: Secondary | ICD-10-CM | POA: Diagnosis not present

## 2016-06-26 DIAGNOSIS — M546 Pain in thoracic spine: Secondary | ICD-10-CM | POA: Diagnosis not present

## 2016-06-27 ENCOUNTER — Other Ambulatory Visit: Payer: Self-pay | Admitting: Family Medicine

## 2016-06-27 ENCOUNTER — Telehealth: Payer: Self-pay | Admitting: Family Medicine

## 2016-06-27 DIAGNOSIS — R718 Other abnormality of red blood cells: Secondary | ICD-10-CM

## 2016-06-27 LAB — COMPREHENSIVE METABOLIC PANEL
ALT: 10 IU/L (ref 0–32)
AST: 15 IU/L (ref 0–40)
Albumin/Globulin Ratio: 1.2 (ref 1.2–2.2)
Albumin: 3.7 g/dL (ref 3.5–5.5)
Alkaline Phosphatase: 96 IU/L (ref 39–117)
BUN/Creatinine Ratio: 18 (ref 9–23)
BUN: 10 mg/dL (ref 6–24)
Bilirubin Total: 0.3 mg/dL (ref 0.0–1.2)
CO2: 25 mmol/L (ref 18–29)
Calcium: 9.2 mg/dL (ref 8.7–10.2)
Chloride: 100 mmol/L (ref 96–106)
Creatinine, Ser: 0.57 mg/dL (ref 0.57–1.00)
GFR calc Af Amer: 132 mL/min/{1.73_m2} (ref >59)
GFR calc non Af Amer: 115 mL/min/{1.73_m2} (ref >59)
Globulin, Total: 3 g/dL (ref 1.5–4.5)
Glucose: 98 mg/dL (ref 65–99)
Potassium: 4.7 mmol/L (ref 3.5–5.2)
Sodium: 138 mmol/L (ref 134–144)
Total Protein: 6.7 g/dL (ref 6.0–8.5)

## 2016-06-27 LAB — CBC WITH DIFFERENTIAL/PLATELET
Basophils Absolute: 0 10*3/uL (ref 0.0–0.2)
Basos: 1 %
EOS (ABSOLUTE): 0.1 10*3/uL (ref 0.0–0.4)
Eos: 2 %
Hematocrit: 35.8 % (ref 34.0–46.6)
Hemoglobin: 11.1 g/dL (ref 11.1–15.9)
Immature Grans (Abs): 0 10*3/uL (ref 0.0–0.1)
Immature Granulocytes: 0 %
Lymphocytes Absolute: 1.8 10*3/uL (ref 0.7–3.1)
Lymphs: 21 %
MCH: 23.4 pg — ABNORMAL LOW (ref 26.6–33.0)
MCHC: 31 g/dL — ABNORMAL LOW (ref 31.5–35.7)
MCV: 76 fL — ABNORMAL LOW (ref 79–97)
Monocytes Absolute: 0.4 10*3/uL (ref 0.1–0.9)
Monocytes: 5 %
Neutrophils Absolute: 6.1 10*3/uL (ref 1.4–7.0)
Neutrophils: 71 %
Platelets: 360 10*3/uL (ref 150–379)
RBC: 4.74 x10E6/uL (ref 3.77–5.28)
RDW: 16.9 % — ABNORMAL HIGH (ref 12.3–15.4)
WBC: 8.6 10*3/uL (ref 3.4–10.8)

## 2016-06-27 LAB — LIPID PANEL
Chol/HDL Ratio: 3.3 ratio units (ref 0.0–4.4)
Cholesterol, Total: 201 mg/dL — ABNORMAL HIGH (ref 100–199)
HDL: 61 mg/dL (ref >39)
LDL Calculated: 95 mg/dL (ref 0–99)
Triglycerides: 227 mg/dL — ABNORMAL HIGH (ref 0–149)
VLDL Cholesterol Cal: 45 mg/dL — ABNORMAL HIGH (ref 5–40)

## 2016-06-27 LAB — HEMOGLOBIN A1C
Est. average glucose Bld gHb Est-mCnc: 108 mg/dL
Hgb A1c MFr Bld: 5.4 % (ref 4.8–5.6)

## 2016-06-27 LAB — LIPASE: Lipase: 20 U/L (ref 0–59)

## 2016-06-27 NOTE — Telephone Encounter (Signed)
done

## 2016-06-27 NOTE — Telephone Encounter (Signed)
Did you contact patient? Please review. KW

## 2016-06-27 NOTE — Telephone Encounter (Signed)
Pt is returning Bob's call.  CB#[phone]/MW

## 2016-08-23 DIAGNOSIS — S72402K Unspecified fracture of lower end of left femur, subsequent encounter for closed fracture with nonunion: Secondary | ICD-10-CM | POA: Diagnosis not present

## 2016-08-23 DIAGNOSIS — T148XXD Other injury of unspecified body region, subsequent encounter: Secondary | ICD-10-CM | POA: Diagnosis not present

## 2016-08-23 DIAGNOSIS — T148XXA Other injury of unspecified body region, initial encounter: Secondary | ICD-10-CM | POA: Diagnosis not present

## 2016-09-07 DIAGNOSIS — M1712 Unilateral primary osteoarthritis, left knee: Secondary | ICD-10-CM | POA: Diagnosis not present

## 2016-09-07 DIAGNOSIS — Z1371 Encounter for nonprocreative screening for genetic disease carrier status: Secondary | ICD-10-CM

## 2016-09-07 DIAGNOSIS — M1612 Unilateral primary osteoarthritis, left hip: Secondary | ICD-10-CM | POA: Diagnosis not present

## 2016-09-07 DIAGNOSIS — Z967 Presence of other bone and tendon implants: Secondary | ICD-10-CM | POA: Diagnosis not present

## 2016-09-07 DIAGNOSIS — T84115A Breakdown (mechanical) of internal fixation device of left femur, initial encounter: Secondary | ICD-10-CM | POA: Diagnosis not present

## 2016-09-07 DIAGNOSIS — S72402K Unspecified fracture of lower end of left femur, subsequent encounter for closed fracture with nonunion: Secondary | ICD-10-CM | POA: Diagnosis not present

## 2016-09-07 HISTORY — DX: Encounter for nonprocreative screening for genetic disease carrier status: Z13.71

## 2016-09-18 ENCOUNTER — Other Ambulatory Visit: Payer: Self-pay | Admitting: Family Medicine

## 2016-09-18 DIAGNOSIS — N390 Urinary tract infection, site not specified: Secondary | ICD-10-CM | POA: Diagnosis not present

## 2016-09-18 DIAGNOSIS — Z124 Encounter for screening for malignant neoplasm of cervix: Secondary | ICD-10-CM | POA: Diagnosis not present

## 2016-09-18 DIAGNOSIS — E282 Polycystic ovarian syndrome: Secondary | ICD-10-CM

## 2016-09-18 DIAGNOSIS — Z1239 Encounter for other screening for malignant neoplasm of breast: Secondary | ICD-10-CM | POA: Diagnosis not present

## 2016-09-18 DIAGNOSIS — Z01411 Encounter for gynecological examination (general) (routine) with abnormal findings: Secondary | ICD-10-CM | POA: Diagnosis not present

## 2016-09-18 DIAGNOSIS — Z803 Family history of malignant neoplasm of breast: Secondary | ICD-10-CM | POA: Diagnosis not present

## 2016-09-18 DIAGNOSIS — N3001 Acute cystitis with hematuria: Secondary | ICD-10-CM | POA: Diagnosis not present

## 2016-09-18 DIAGNOSIS — Z1151 Encounter for screening for human papillomavirus (HPV): Secondary | ICD-10-CM | POA: Diagnosis not present

## 2016-09-18 DIAGNOSIS — R350 Frequency of micturition: Secondary | ICD-10-CM | POA: Diagnosis not present

## 2016-10-08 HISTORY — PX: BARIATRIC SURGERY: SHX1103

## 2016-10-19 DIAGNOSIS — Z8041 Family history of malignant neoplasm of ovary: Secondary | ICD-10-CM | POA: Diagnosis not present

## 2016-10-19 DIAGNOSIS — Z803 Family history of malignant neoplasm of breast: Secondary | ICD-10-CM | POA: Diagnosis not present

## 2016-10-25 ENCOUNTER — Other Ambulatory Visit: Payer: Self-pay | Admitting: Family Medicine

## 2016-10-25 DIAGNOSIS — E282 Polycystic ovarian syndrome: Secondary | ICD-10-CM

## 2016-10-26 NOTE — Telephone Encounter (Signed)
[  please review-aa

## 2016-10-30 DIAGNOSIS — Z315 Encounter for genetic counseling: Secondary | ICD-10-CM | POA: Diagnosis not present

## 2016-10-30 DIAGNOSIS — Z8041 Family history of malignant neoplasm of ovary: Secondary | ICD-10-CM | POA: Diagnosis not present

## 2016-10-30 DIAGNOSIS — Z803 Family history of malignant neoplasm of breast: Secondary | ICD-10-CM | POA: Diagnosis not present

## 2016-11-01 DIAGNOSIS — S72352K Displaced comminuted fracture of shaft of left femur, subsequent encounter for closed fracture with nonunion: Secondary | ICD-10-CM | POA: Diagnosis not present

## 2016-11-01 DIAGNOSIS — S72402K Unspecified fracture of lower end of left femur, subsequent encounter for closed fracture with nonunion: Secondary | ICD-10-CM | POA: Diagnosis not present

## 2016-11-01 DIAGNOSIS — T84418A Breakdown (mechanical) of other internal orthopedic devices, implants and grafts, initial encounter: Secondary | ICD-10-CM | POA: Diagnosis not present

## 2016-11-05 DIAGNOSIS — F4323 Adjustment disorder with mixed anxiety and depressed mood: Secondary | ICD-10-CM | POA: Diagnosis not present

## 2016-11-06 DIAGNOSIS — F4323 Adjustment disorder with mixed anxiety and depressed mood: Secondary | ICD-10-CM | POA: Diagnosis not present

## 2016-11-23 DIAGNOSIS — Z01818 Encounter for other preprocedural examination: Secondary | ICD-10-CM | POA: Diagnosis not present

## 2016-11-27 DIAGNOSIS — Z01818 Encounter for other preprocedural examination: Secondary | ICD-10-CM | POA: Diagnosis not present

## 2016-12-11 ENCOUNTER — Encounter: Payer: Self-pay | Admitting: Internal Medicine

## 2016-12-11 ENCOUNTER — Ambulatory Visit (INDEPENDENT_AMBULATORY_CARE_PROVIDER_SITE_OTHER): Payer: BLUE CROSS/BLUE SHIELD | Admitting: Internal Medicine

## 2016-12-11 DIAGNOSIS — Z01810 Encounter for preprocedural cardiovascular examination: Secondary | ICD-10-CM | POA: Diagnosis not present
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
## 2016-12-11 NOTE — Patient Instructions (Signed)
Medication Instructions:  Your physician recommends that you continue on your current medications as directed. Please refer to the Current Medication list given to you today.   Labwork: none  Testing/Procedures: none  Follow-Up: Your physician recommends that you schedule a follow-up appointment ON AN AS NEEDED BASIS.

## 2016-12-12 ENCOUNTER — Encounter: Payer: Self-pay | Admitting: Internal Medicine

## 2016-12-18 DIAGNOSIS — Z01818 Encounter for other preprocedural examination: Secondary | ICD-10-CM | POA: Diagnosis not present

## 2016-12-21 DIAGNOSIS — Z7982 Long term (current) use of aspirin: Secondary | ICD-10-CM | POA: Diagnosis not present

## 2016-12-21 DIAGNOSIS — Z6841 Body Mass Index (BMI) 40.0 and over, adult: Secondary | ICD-10-CM | POA: Diagnosis not present

## 2016-12-21 DIAGNOSIS — R7303 Prediabetes: Secondary | ICD-10-CM | POA: Diagnosis not present

## 2016-12-21 DIAGNOSIS — D689 Coagulation defect, unspecified: Secondary | ICD-10-CM | POA: Diagnosis not present

## 2016-12-21 DIAGNOSIS — Z408 Encounter for other prophylactic surgery: Secondary | ICD-10-CM | POA: Diagnosis not present

## 2016-12-21 DIAGNOSIS — Z9081 Acquired absence of spleen: Secondary | ICD-10-CM | POA: Diagnosis not present

## 2016-12-21 DIAGNOSIS — I10 Essential (primary) hypertension: Secondary | ICD-10-CM | POA: Diagnosis not present

## 2016-12-21 DIAGNOSIS — Z7984 Long term (current) use of oral hypoglycemic drugs: Secondary | ICD-10-CM | POA: Diagnosis not present

## 2016-12-21 DIAGNOSIS — E282 Polycystic ovarian syndrome: Secondary | ICD-10-CM | POA: Diagnosis not present

## 2016-12-25 DIAGNOSIS — K8010 Calculus of gallbladder with chronic cholecystitis without obstruction: Secondary | ICD-10-CM | POA: Diagnosis not present

## 2016-12-25 DIAGNOSIS — K3189 Other diseases of stomach and duodenum: Secondary | ICD-10-CM | POA: Diagnosis not present

## 2016-12-25 DIAGNOSIS — K824 Cholesterolosis of gallbladder: Secondary | ICD-10-CM | POA: Diagnosis not present

## 2016-12-25 DIAGNOSIS — K2950 Unspecified chronic gastritis without bleeding: Secondary | ICD-10-CM | POA: Diagnosis not present

## 2017-01-14 DIAGNOSIS — R29898 Other symptoms and signs involving the musculoskeletal system: Secondary | ICD-10-CM | POA: Diagnosis not present

## 2017-01-14 DIAGNOSIS — Z7982 Long term (current) use of aspirin: Secondary | ICD-10-CM | POA: Diagnosis not present

## 2017-01-14 DIAGNOSIS — Z993 Dependence on wheelchair: Secondary | ICD-10-CM | POA: Diagnosis not present

## 2017-01-14 DIAGNOSIS — Z7984 Long term (current) use of oral hypoglycemic drugs: Secondary | ICD-10-CM | POA: Diagnosis not present

## 2017-01-14 DIAGNOSIS — Z79899 Other long term (current) drug therapy: Secondary | ICD-10-CM | POA: Diagnosis not present

## 2017-01-14 DIAGNOSIS — Z7409 Other reduced mobility: Secondary | ICD-10-CM | POA: Diagnosis not present

## 2017-01-14 DIAGNOSIS — Z87828 Personal history of other (healed) physical injury and trauma: Secondary | ICD-10-CM | POA: Diagnosis not present

## 2017-01-14 DIAGNOSIS — E119 Type 2 diabetes mellitus without complications: Secondary | ICD-10-CM | POA: Diagnosis not present

## 2017-01-14 DIAGNOSIS — S72452K Displaced supracondylar fracture without intracondylar extension of lower end of left femur, subsequent encounter for closed fracture with nonunion: Secondary | ICD-10-CM | POA: Diagnosis not present

## 2017-01-14 DIAGNOSIS — Z9889 Other specified postprocedural states: Secondary | ICD-10-CM | POA: Diagnosis not present

## 2017-01-14 DIAGNOSIS — I10 Essential (primary) hypertension: Secondary | ICD-10-CM | POA: Diagnosis not present

## 2017-03-07 DIAGNOSIS — M80852K Other osteoporosis with current pathological fracture, left femur, subsequent encounter for fracture with nonunion: Secondary | ICD-10-CM | POA: Diagnosis not present

## 2017-06-19 DIAGNOSIS — Z9884 Bariatric surgery status: Secondary | ICD-10-CM | POA: Diagnosis not present

## 2017-09-05 DIAGNOSIS — S72402K Unspecified fracture of lower end of left femur, subsequent encounter for closed fracture with nonunion: Secondary | ICD-10-CM | POA: Diagnosis not present

## 2017-09-05 DIAGNOSIS — S72402D Unspecified fracture of lower end of left femur, subsequent encounter for closed fracture with routine healing: Secondary | ICD-10-CM | POA: Diagnosis not present

## 2017-09-05 DIAGNOSIS — S92111K Displaced fracture of neck of right talus, subsequent encounter for fracture with nonunion: Secondary | ICD-10-CM | POA: Diagnosis not present

## 2017-09-09 ENCOUNTER — Other Ambulatory Visit: Payer: Self-pay

## 2017-09-09 DIAGNOSIS — S72402K Unspecified fracture of lower end of left femur, subsequent encounter for closed fracture with nonunion: Secondary | ICD-10-CM

## 2017-09-09 DIAGNOSIS — S92111K Displaced fracture of neck of right talus, subsequent encounter for fracture with nonunion: Secondary | ICD-10-CM | POA: Diagnosis not present

## 2017-09-09 DIAGNOSIS — S72352K Displaced comminuted fracture of shaft of left femur, subsequent encounter for closed fracture with nonunion: Secondary | ICD-10-CM | POA: Diagnosis not present

## 2017-09-10 LAB — COMPREHENSIVE METABOLIC PANEL
ALT: 19 IU/L (ref 0–32)
AST: 21 IU/L (ref 0–40)
Albumin/Globulin Ratio: 1.6 (ref 1.2–2.2)
Albumin: 4 g/dL (ref 3.5–5.5)
Alkaline Phosphatase: 112 IU/L (ref 39–117)
BUN/Creatinine Ratio: 25 — ABNORMAL HIGH (ref 9–23)
BUN: 13 mg/dL (ref 6–24)
Bilirubin Total: 0.2 mg/dL (ref 0.0–1.2)
CO2: 23 mmol/L (ref 20–29)
Calcium: 9.4 mg/dL (ref 8.7–10.2)
Chloride: 100 mmol/L (ref 96–106)
Creatinine, Ser: 0.52 mg/dL — ABNORMAL LOW (ref 0.57–1.00)
GFR calc Af Amer: 135 mL/min/{1.73_m2} (ref >59)
GFR calc non Af Amer: 117 mL/min/{1.73_m2} (ref >59)
Globulin, Total: 2.5 g/dL (ref 1.5–4.5)
Glucose: 89 mg/dL (ref 65–99)
Potassium: 4 mmol/L (ref 3.5–5.2)
Sodium: 135 mmol/L (ref 134–144)
Total Protein: 6.5 g/dL (ref 6.0–8.5)

## 2017-10-24 DIAGNOSIS — S72452K Displaced supracondylar fracture without intracondylar extension of lower end of left femur, subsequent encounter for closed fracture with nonunion: Secondary | ICD-10-CM | POA: Diagnosis not present

## 2017-10-24 DIAGNOSIS — M19171 Post-traumatic osteoarthritis, right ankle and foot: Secondary | ICD-10-CM | POA: Diagnosis not present

## 2017-11-08 DIAGNOSIS — S72452K Displaced supracondylar fracture without intracondylar extension of lower end of left femur, subsequent encounter for closed fracture with nonunion: Secondary | ICD-10-CM | POA: Insufficient documentation

## 2017-12-05 DIAGNOSIS — M19171 Post-traumatic osteoarthritis, right ankle and foot: Secondary | ICD-10-CM | POA: Diagnosis not present

## 2017-12-31 DIAGNOSIS — Z6835 Body mass index (BMI) 35.0-35.9, adult: Secondary | ICD-10-CM | POA: Diagnosis not present

## 2017-12-31 DIAGNOSIS — S72452K Displaced supracondylar fracture without intracondylar extension of lower end of left femur, subsequent encounter for closed fracture with nonunion: Secondary | ICD-10-CM | POA: Diagnosis not present

## 2017-12-31 DIAGNOSIS — R7303 Prediabetes: Secondary | ICD-10-CM | POA: Diagnosis not present

## 2017-12-31 DIAGNOSIS — Z9884 Bariatric surgery status: Secondary | ICD-10-CM | POA: Diagnosis not present

## 2017-12-31 DIAGNOSIS — I10 Essential (primary) hypertension: Secondary | ICD-10-CM | POA: Diagnosis not present

## 2017-12-31 DIAGNOSIS — K469 Unspecified abdominal hernia without obstruction or gangrene: Secondary | ICD-10-CM | POA: Diagnosis not present

## 2018-01-06 HISTORY — PX: FEMUR FRACTURE SURGERY: SHX633

## 2018-01-14 DIAGNOSIS — Z9884 Bariatric surgery status: Secondary | ICD-10-CM | POA: Diagnosis not present

## 2018-01-20 DIAGNOSIS — R7303 Prediabetes: Secondary | ICD-10-CM | POA: Diagnosis not present

## 2018-01-20 DIAGNOSIS — Z9884 Bariatric surgery status: Secondary | ICD-10-CM | POA: Diagnosis not present

## 2018-01-20 DIAGNOSIS — I10 Essential (primary) hypertension: Secondary | ICD-10-CM | POA: Diagnosis not present

## 2018-01-20 DIAGNOSIS — Z79899 Other long term (current) drug therapy: Secondary | ICD-10-CM | POA: Diagnosis not present

## 2018-01-20 DIAGNOSIS — G8918 Other acute postprocedural pain: Secondary | ICD-10-CM | POA: Diagnosis not present

## 2018-01-20 DIAGNOSIS — Z6841 Body Mass Index (BMI) 40.0 and over, adult: Secondary | ICD-10-CM | POA: Diagnosis not present

## 2018-01-20 DIAGNOSIS — S72452K Displaced supracondylar fracture without intracondylar extension of lower end of left femur, subsequent encounter for closed fracture with nonunion: Secondary | ICD-10-CM | POA: Diagnosis not present

## 2018-01-21 DIAGNOSIS — Z6841 Body Mass Index (BMI) 40.0 and over, adult: Secondary | ICD-10-CM | POA: Diagnosis not present

## 2018-01-21 DIAGNOSIS — S72452K Displaced supracondylar fracture without intracondylar extension of lower end of left femur, subsequent encounter for closed fracture with nonunion: Secondary | ICD-10-CM | POA: Diagnosis not present

## 2018-01-21 DIAGNOSIS — I10 Essential (primary) hypertension: Secondary | ICD-10-CM | POA: Diagnosis not present

## 2018-01-21 DIAGNOSIS — Z79899 Other long term (current) drug therapy: Secondary | ICD-10-CM | POA: Diagnosis not present

## 2018-01-21 DIAGNOSIS — Z9884 Bariatric surgery status: Secondary | ICD-10-CM | POA: Diagnosis not present

## 2018-01-21 DIAGNOSIS — R7303 Prediabetes: Secondary | ICD-10-CM | POA: Diagnosis not present

## 2018-01-27 DIAGNOSIS — K432 Incisional hernia without obstruction or gangrene: Secondary | ICD-10-CM | POA: Diagnosis not present

## 2018-01-27 DIAGNOSIS — R1012 Left upper quadrant pain: Secondary | ICD-10-CM | POA: Diagnosis not present

## 2018-01-27 DIAGNOSIS — Z9884 Bariatric surgery status: Secondary | ICD-10-CM | POA: Diagnosis not present

## 2018-03-04 DIAGNOSIS — S72402D Unspecified fracture of lower end of left femur, subsequent encounter for closed fracture with routine healing: Secondary | ICD-10-CM | POA: Diagnosis not present

## 2018-04-22 DIAGNOSIS — S72402K Unspecified fracture of lower end of left femur, subsequent encounter for closed fracture with nonunion: Secondary | ICD-10-CM | POA: Diagnosis not present

## 2018-04-22 DIAGNOSIS — M87071 Idiopathic aseptic necrosis of right ankle: Secondary | ICD-10-CM | POA: Diagnosis not present

## 2018-04-22 DIAGNOSIS — M25571 Pain in right ankle and joints of right foot: Secondary | ICD-10-CM | POA: Diagnosis not present

## 2018-04-22 DIAGNOSIS — S72402D Unspecified fracture of lower end of left femur, subsequent encounter for closed fracture with routine healing: Secondary | ICD-10-CM | POA: Diagnosis not present

## 2018-04-22 DIAGNOSIS — S92111D Displaced fracture of neck of right talus, subsequent encounter for fracture with routine healing: Secondary | ICD-10-CM | POA: Diagnosis not present

## 2018-04-22 DIAGNOSIS — T148XXA Other injury of unspecified body region, initial encounter: Secondary | ICD-10-CM | POA: Diagnosis not present

## 2018-04-22 DIAGNOSIS — M19071 Primary osteoarthritis, right ankle and foot: Secondary | ICD-10-CM | POA: Diagnosis not present

## 2018-04-22 DIAGNOSIS — S72402A Unspecified fracture of lower end of left femur, initial encounter for closed fracture: Secondary | ICD-10-CM | POA: Diagnosis not present

## 2018-05-19 DIAGNOSIS — Z6834 Body mass index (BMI) 34.0-34.9, adult: Secondary | ICD-10-CM | POA: Diagnosis not present

## 2018-05-19 DIAGNOSIS — K432 Incisional hernia without obstruction or gangrene: Secondary | ICD-10-CM | POA: Diagnosis not present

## 2018-06-02 DIAGNOSIS — Z6834 Body mass index (BMI) 34.0-34.9, adult: Secondary | ICD-10-CM | POA: Diagnosis not present

## 2018-06-02 DIAGNOSIS — K458 Other specified abdominal hernia without obstruction or gangrene: Secondary | ICD-10-CM | POA: Diagnosis not present

## 2018-06-23 DIAGNOSIS — Z9884 Bariatric surgery status: Secondary | ICD-10-CM | POA: Diagnosis not present

## 2018-06-23 DIAGNOSIS — K219 Gastro-esophageal reflux disease without esophagitis: Secondary | ICD-10-CM | POA: Diagnosis not present

## 2018-06-23 DIAGNOSIS — K432 Incisional hernia without obstruction or gangrene: Secondary | ICD-10-CM | POA: Diagnosis not present

## 2018-06-24 DIAGNOSIS — S72402A Unspecified fracture of lower end of left femur, initial encounter for closed fracture: Secondary | ICD-10-CM | POA: Diagnosis not present

## 2018-06-24 DIAGNOSIS — M19171 Post-traumatic osteoarthritis, right ankle and foot: Secondary | ICD-10-CM | POA: Diagnosis not present

## 2018-06-24 DIAGNOSIS — M1712 Unilateral primary osteoarthritis, left knee: Secondary | ICD-10-CM | POA: Diagnosis not present

## 2018-06-24 DIAGNOSIS — S72402K Unspecified fracture of lower end of left femur, subsequent encounter for closed fracture with nonunion: Secondary | ICD-10-CM | POA: Diagnosis not present

## 2018-06-24 DIAGNOSIS — T148XXA Other injury of unspecified body region, initial encounter: Secondary | ICD-10-CM | POA: Diagnosis not present

## 2018-07-07 DIAGNOSIS — E663 Overweight: Secondary | ICD-10-CM | POA: Diagnosis not present

## 2018-07-07 DIAGNOSIS — Z6834 Body mass index (BMI) 34.0-34.9, adult: Secondary | ICD-10-CM | POA: Diagnosis not present

## 2018-07-20 ENCOUNTER — Other Ambulatory Visit: Payer: Self-pay

## 2018-07-20 ENCOUNTER — Emergency Department
Admission: EM | Admit: 2018-07-20 | Discharge: 2018-07-20 | Disposition: A | Discharge status: Home / Self Care | Payer: BLUE CROSS/BLUE SHIELD | Attending: Emergency Medicine | Admitting: Emergency Medicine

## 2018-07-20 ENCOUNTER — Encounter: Payer: Self-pay | Admitting: Emergency Medicine

## 2018-07-20 ENCOUNTER — Emergency Department: Payer: BLUE CROSS/BLUE SHIELD

## 2018-07-20 DIAGNOSIS — Z7982 Long term (current) use of aspirin: Secondary | ICD-10-CM | POA: Diagnosis not present

## 2018-07-20 DIAGNOSIS — Y999 Unspecified external cause status: Secondary | ICD-10-CM | POA: Insufficient documentation

## 2018-07-20 DIAGNOSIS — Z7984 Long term (current) use of oral hypoglycemic drugs: Secondary | ICD-10-CM | POA: Insufficient documentation

## 2018-07-20 DIAGNOSIS — S92351A Displaced fracture of fifth metatarsal bone, right foot, initial encounter for closed fracture: Secondary | ICD-10-CM | POA: Insufficient documentation

## 2018-07-20 DIAGNOSIS — Y9301 Activity, walking, marching and hiking: Secondary | ICD-10-CM | POA: Diagnosis not present

## 2018-07-20 DIAGNOSIS — X500XXA Overexertion from strenuous movement or load, initial encounter: Secondary | ICD-10-CM | POA: Diagnosis not present

## 2018-07-20 DIAGNOSIS — S99921A Unspecified injury of right foot, initial encounter: Secondary | ICD-10-CM | POA: Diagnosis present

## 2018-07-20 DIAGNOSIS — Y92480 Sidewalk as the place of occurrence of the external cause: Secondary | ICD-10-CM | POA: Insufficient documentation

## 2018-07-20 NOTE — ED Notes (Signed)
Pt stepped on a curb and felt a "popping" sensation on the lateral aspect of her R foot 2 days ago. Pt states that there is significant pain along the lateral aspect of the L foot. She is able to ambulate if she walks on her heel but can not bear weight directly on the affected area. Pedal pulse intact, Cap refill <3 seconds, moderate 2+ non pitting edema. Pt claims reduced sensation to L foot is normal for her due to reconstructive surgery on her ankle from several years ago.
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
## 2018-07-20 NOTE — ED Triage Notes (Addendum)
Pt states she was out of town at a conference on Thursday and stepped up on a sidewalk and felt something pop in her right foot and thinks it may be broken, states she breaks bones easily.

## 2018-07-20 NOTE — Discharge Instructions (Signed)
Keep your appointment with your orthopedist on Tuesday.  Ice and elevation for comfort and to reduce any swelling.  Wear postop shoe for support and protection.  May take Tylenol or ibuprofen as needed for pain.

## 2018-07-22 DIAGNOSIS — S92354A Nondisplaced fracture of fifth metatarsal bone, right foot, initial encounter for closed fracture: Secondary | ICD-10-CM | POA: Diagnosis not present

## 2018-07-22 DIAGNOSIS — S92101K Unspecified fracture of right talus, subsequent encounter for fracture with nonunion: Secondary | ICD-10-CM | POA: Diagnosis not present

## 2018-07-22 DIAGNOSIS — M8700 Idiopathic aseptic necrosis of unspecified bone: Secondary | ICD-10-CM | POA: Diagnosis not present

## 2018-07-22 DIAGNOSIS — S92111K Displaced fracture of neck of right talus, subsequent encounter for fracture with nonunion: Secondary | ICD-10-CM | POA: Diagnosis not present

## 2018-07-28 DIAGNOSIS — Z967 Presence of other bone and tendon implants: Secondary | ICD-10-CM | POA: Diagnosis not present

## 2018-07-28 DIAGNOSIS — S92111D Displaced fracture of neck of right talus, subsequent encounter for fracture with routine healing: Secondary | ICD-10-CM | POA: Diagnosis not present

## 2018-07-28 DIAGNOSIS — M8700 Idiopathic aseptic necrosis of unspecified bone: Secondary | ICD-10-CM | POA: Diagnosis not present

## 2018-07-28 DIAGNOSIS — M19071 Primary osteoarthritis, right ankle and foot: Secondary | ICD-10-CM | POA: Diagnosis not present

## 2018-07-28 DIAGNOSIS — M898X7 Other specified disorders of bone, ankle and foot: Secondary | ICD-10-CM | POA: Diagnosis not present

## 2018-07-28 DIAGNOSIS — M19171 Post-traumatic osteoarthritis, right ankle and foot: Secondary | ICD-10-CM | POA: Diagnosis not present

## 2018-07-29 DIAGNOSIS — S99911D Unspecified injury of right ankle, subsequent encounter: Secondary | ICD-10-CM | POA: Diagnosis not present

## 2018-07-29 DIAGNOSIS — M8700 Idiopathic aseptic necrosis of unspecified bone: Secondary | ICD-10-CM

## 2018-07-29 HISTORY — DX: Idiopathic aseptic necrosis of unspecified bone: M87.00

## 2018-08-05 ENCOUNTER — Encounter: Payer: Self-pay | Admitting: Obstetrics and Gynecology

## 2018-08-05 ENCOUNTER — Ambulatory Visit (INDEPENDENT_AMBULATORY_CARE_PROVIDER_SITE_OTHER): Payer: BLUE CROSS/BLUE SHIELD | Admitting: Obstetrics and Gynecology

## 2018-08-05 VITALS — BP 114/60 | HR 72 | Ht 62.5 in | Wt 177.0 lb

## 2018-08-05 DIAGNOSIS — Z803 Family history of malignant neoplasm of breast: Secondary | ICD-10-CM

## 2018-08-05 DIAGNOSIS — Z01419 Encounter for gynecological examination (general) (routine) without abnormal findings: Secondary | ICD-10-CM | POA: Diagnosis not present

## 2018-08-05 DIAGNOSIS — Z1239 Encounter for other screening for malignant neoplasm of breast: Secondary | ICD-10-CM
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
## 2018-08-05 NOTE — Patient Instructions (Signed)
I value your feedback and entrusting [place] with your care. If you get a [facility] patient survey, I would appreciate you taking the time to let us know about your experience today. Thank you!  [place] at [facility]: [phone]

## 2018-08-13 DIAGNOSIS — M19071 Primary osteoarthritis, right ankle and foot: Secondary | ICD-10-CM | POA: Diagnosis not present

## 2018-08-13 DIAGNOSIS — Z969 Presence of functional implant, unspecified: Secondary | ICD-10-CM | POA: Diagnosis not present

## 2018-08-13 DIAGNOSIS — Z79899 Other long term (current) drug therapy: Secondary | ICD-10-CM | POA: Diagnosis not present

## 2018-08-13 DIAGNOSIS — M8700 Idiopathic aseptic necrosis of unspecified bone: Secondary | ICD-10-CM | POA: Diagnosis not present

## 2018-08-13 DIAGNOSIS — G8918 Other acute postprocedural pain: Secondary | ICD-10-CM | POA: Diagnosis not present

## 2018-08-13 DIAGNOSIS — M87071 Idiopathic aseptic necrosis of right ankle: Secondary | ICD-10-CM | POA: Diagnosis not present

## 2018-08-13 DIAGNOSIS — I10 Essential (primary) hypertension: Secondary | ICD-10-CM | POA: Diagnosis not present

## 2018-08-13 DIAGNOSIS — R7303 Prediabetes: Secondary | ICD-10-CM | POA: Diagnosis not present

## 2018-08-14 DIAGNOSIS — I10 Essential (primary) hypertension: Secondary | ICD-10-CM | POA: Diagnosis not present

## 2018-08-14 DIAGNOSIS — M19071 Primary osteoarthritis, right ankle and foot: Secondary | ICD-10-CM | POA: Diagnosis not present

## 2018-08-14 DIAGNOSIS — R7303 Prediabetes: Secondary | ICD-10-CM | POA: Diagnosis not present

## 2018-08-14 DIAGNOSIS — Z79899 Other long term (current) drug therapy: Secondary | ICD-10-CM | POA: Diagnosis not present

## 2018-08-14 DIAGNOSIS — M8700 Idiopathic aseptic necrosis of unspecified bone: Secondary | ICD-10-CM | POA: Diagnosis not present

## 2018-08-25 DIAGNOSIS — M21371 Foot drop, right foot: Secondary | ICD-10-CM | POA: Diagnosis not present

## 2018-09-02 ENCOUNTER — Encounter: Payer: Self-pay | Admitting: Obstetrics and Gynecology

## 2018-09-02 ENCOUNTER — Ambulatory Visit
Admission: RE | Admit: 2018-09-02 | Discharge: 2018-09-02 | Disposition: A | Discharge status: Home / Self Care | Payer: BLUE CROSS/BLUE SHIELD | Source: Ambulatory Visit | Attending: Obstetrics and Gynecology | Admitting: Obstetrics and Gynecology

## 2018-09-02 DIAGNOSIS — Z803 Family history of malignant neoplasm of breast: Secondary | ICD-10-CM | POA: Insufficient documentation

## 2018-09-02 DIAGNOSIS — Z1231 Encounter for screening mammogram for malignant neoplasm of breast: Secondary | ICD-10-CM | POA: Diagnosis not present

## 2018-09-02 DIAGNOSIS — Z1239 Encounter for other screening for malignant neoplasm of breast: Secondary | ICD-10-CM | POA: Insufficient documentation

## 2018-09-22 DIAGNOSIS — M8700 Idiopathic aseptic necrosis of unspecified bone: Secondary | ICD-10-CM | POA: Diagnosis not present

## 2018-09-22 DIAGNOSIS — S92351D Displaced fracture of fifth metatarsal bone, right foot, subsequent encounter for fracture with routine healing: Secondary | ICD-10-CM | POA: Diagnosis not present

## 2018-09-22 DIAGNOSIS — M85871 Other specified disorders of bone density and structure, right ankle and foot: Secondary | ICD-10-CM | POA: Diagnosis not present

## 2018-10-16 DIAGNOSIS — S72402D Unspecified fracture of lower end of left femur, subsequent encounter for closed fracture with routine healing: Secondary | ICD-10-CM | POA: Diagnosis not present

## 2018-10-16 DIAGNOSIS — T84218D Breakdown (mechanical) of internal fixation device of other bones, subsequent encounter: Secondary | ICD-10-CM | POA: Diagnosis not present

## 2018-10-16 DIAGNOSIS — S72409D Unspecified fracture of lower end of unspecified femur, subsequent encounter for closed fracture with routine healing: Secondary | ICD-10-CM | POA: Diagnosis not present

## 2018-10-16 DIAGNOSIS — S72452K Displaced supracondylar fracture without intracondylar extension of lower end of left femur, subsequent encounter for closed fracture with nonunion: Secondary | ICD-10-CM | POA: Diagnosis not present

## 2018-10-20 DIAGNOSIS — M85871 Other specified disorders of bone density and structure, right ankle and foot: Secondary | ICD-10-CM | POA: Diagnosis not present

## 2018-10-20 DIAGNOSIS — S92351D Displaced fracture of fifth metatarsal bone, right foot, subsequent encounter for fracture with routine healing: Secondary | ICD-10-CM | POA: Diagnosis not present

## 2018-12-12 ENCOUNTER — Encounter: Payer: Self-pay | Admitting: Nurse Practitioner

## 2018-12-12 ENCOUNTER — Ambulatory Visit: Payer: BLUE CROSS/BLUE SHIELD | Admitting: Nurse Practitioner

## 2018-12-12 ENCOUNTER — Other Ambulatory Visit: Payer: Self-pay

## 2018-12-12 VITALS — BP 129/77 | HR 83 | Temp 98.3°F | Resp 16 | Ht 63.0 in | Wt 169.0 lb

## 2018-12-12 DIAGNOSIS — R51 Headache: Secondary | ICD-10-CM

## 2018-12-12 DIAGNOSIS — J0100 Acute maxillary sinusitis, unspecified: Secondary | ICD-10-CM

## 2018-12-12 DIAGNOSIS — R519 Headache, unspecified: Secondary | ICD-10-CM

## 2018-12-12 MED ORDER — AMOXICILLIN-POT CLAVULANATE 875-125 MG PO TABS: 1.0000 | ORAL_TABLET | Freq: Two times a day (BID) | ORAL | 0 refills | Status: DC

## 2018-12-12 NOTE — Progress Notes (Addendum)
   Subjective:    Patient ID: Mariah Martin, female    DOB: [birth year], 43 y.o.   MRN: [id number]  HPIJaunita is here today with c/o sinus problems, headache, facial and teeth pressure x 2 weeks. She reports 2 weeks ago she only has a runny nose and started OTC Flonase twice daily and increased fluids. In the last few days she has developed headache with facial and teeth pain that is worsening. She denies fever, ear pain, sore throat, cough, SOB, wheezing or chest pain.    Review of Systems  Constitutional: Negative for fever.  HENT: Positive for sinus pressure and sinus pain. Negative for ear pain.        Upper teeth pain "molar"   Respiratory: Negative for cough and wheezing.   Cardiovascular: Negative for chest pain.  Neurological: Positive for headaches.       Objective:   Physical Exam Vitals signs reviewed.  Constitutional:      Appearance: Normal appearance. She is well-developed.  HENT:     Head: Normocephalic and atraumatic.     Comments: Maxillary sinus tenderness bilaterally and no frontal sinus tenderness    Right Ear: Ear canal normal.     Left Ear: Ear canal normal.     Ears:     Comments: Bilateral ears with clear fluid and bubbles, no erythema to intact TM    Mouth/Throat:     Comments: Injected with cobblestoning Neck:     Musculoskeletal: Normal range of motion and neck supple. No muscular tenderness.  Cardiovascular:     Rate and Rhythm: Normal rate and regular rhythm.     Heart sounds: Murmur present.  Pulmonary:     Effort: Pulmonary effort is normal. No respiratory distress.     Breath sounds: Normal breath sounds.  Abdominal:     General: Bowel sounds are normal.     Palpations: Abdomen is soft.  Musculoskeletal: Normal range of motion.  Lymphadenopathy:     Cervical: Cervical adenopathy present.  Skin:    General: Skin is warm and dry.  Neurological:     Mental Status: She is alert and oriented to person, place, and time.  Psychiatric:         Mood and Affect: Mood normal.           Assessment & Plan:

## 2018-12-12 NOTE — Patient Instructions (Addendum)
Plenty of fluids, hand washing and rest encouraged Get over the counter Fexofenadine and take until symptoms resolve Continue Flonase 1 spray to each nasal twice daily until symptoms resolve Can take Tylenol as directed for sinus headache Take all meds as directed Encouraged patient to call the office or primary care doctor for an appointment if no improvement in symptoms or if symptoms change or worsen after 72 hours of planned treatment. Patient verbalized understanding of all instructions given/reviewed and has no further questions or concerns at this time.

## 2019-02-18 DIAGNOSIS — M8700 Idiopathic aseptic necrosis of unspecified bone: Secondary | ICD-10-CM | POA: Diagnosis not present

## 2019-02-18 DIAGNOSIS — Z9884 Bariatric surgery status: Secondary | ICD-10-CM

## 2019-02-18 DIAGNOSIS — Z6829 Body mass index (BMI) 29.0-29.9, adult: Secondary | ICD-10-CM | POA: Insufficient documentation

## 2019-02-18 HISTORY — DX: Bariatric surgery status: Z98.84

## 2019-02-20 DIAGNOSIS — Z6829 Body mass index (BMI) 29.0-29.9, adult: Secondary | ICD-10-CM | POA: Diagnosis not present

## 2019-02-20 DIAGNOSIS — K219 Gastro-esophageal reflux disease without esophagitis: Secondary | ICD-10-CM | POA: Diagnosis not present

## 2019-02-20 DIAGNOSIS — Z9884 Bariatric surgery status: Secondary | ICD-10-CM | POA: Diagnosis not present

## 2019-02-27 DIAGNOSIS — M8700 Idiopathic aseptic necrosis of unspecified bone: Secondary | ICD-10-CM | POA: Diagnosis not present

## 2019-02-27 DIAGNOSIS — Z981 Arthrodesis status: Secondary | ICD-10-CM | POA: Diagnosis not present

## 2019-03-06 DIAGNOSIS — S92351D Displaced fracture of fifth metatarsal bone, right foot, subsequent encounter for fracture with routine healing: Secondary | ICD-10-CM | POA: Diagnosis not present

## 2019-03-06 DIAGNOSIS — M1711 Unilateral primary osteoarthritis, right knee: Secondary | ICD-10-CM | POA: Diagnosis not present

## 2019-03-06 DIAGNOSIS — M25461 Effusion, right knee: Secondary | ICD-10-CM | POA: Diagnosis not present

## 2019-03-06 DIAGNOSIS — S82001A Unspecified fracture of right patella, initial encounter for closed fracture: Secondary | ICD-10-CM | POA: Diagnosis not present

## 2019-03-06 DIAGNOSIS — S82001D Unspecified fracture of right patella, subsequent encounter for closed fracture with routine healing: Secondary | ICD-10-CM | POA: Diagnosis not present

## 2019-03-06 DIAGNOSIS — Z981 Arthrodesis status: Secondary | ICD-10-CM | POA: Diagnosis not present

## 2019-03-06 DIAGNOSIS — M8700 Idiopathic aseptic necrosis of unspecified bone: Secondary | ICD-10-CM | POA: Diagnosis not present

## 2019-04-08 DIAGNOSIS — M8700 Idiopathic aseptic necrosis of unspecified bone: Secondary | ICD-10-CM | POA: Diagnosis not present

## 2019-05-22 DIAGNOSIS — R0981 Nasal congestion: Secondary | ICD-10-CM | POA: Diagnosis not present

## 2019-05-22 DIAGNOSIS — Z20828 Contact with and (suspected) exposure to other viral communicable diseases: Secondary | ICD-10-CM | POA: Diagnosis not present

## 2019-06-22 ENCOUNTER — Encounter: Payer: Self-pay | Admitting: Physician Assistant

## 2019-06-22 ENCOUNTER — Other Ambulatory Visit: Payer: Self-pay

## 2019-06-22 ENCOUNTER — Ambulatory Visit (INDEPENDENT_AMBULATORY_CARE_PROVIDER_SITE_OTHER): Payer: BC Managed Care – PPO | Admitting: Physician Assistant

## 2019-06-22 VITALS — BP 130/76 | HR 64 | Temp 97.3°F | Resp 16 | Ht 62.5 in | Wt 173.8 lb

## 2019-06-22 DIAGNOSIS — Z114 Encounter for screening for human immunodeficiency virus [HIV]: Secondary | ICD-10-CM

## 2019-06-22 DIAGNOSIS — E041 Nontoxic single thyroid nodule: Secondary | ICD-10-CM

## 2019-06-22 DIAGNOSIS — Z0000 Encounter for general adult medical examination without abnormal findings: Secondary | ICD-10-CM | POA: Diagnosis not present

## 2019-06-22 DIAGNOSIS — E538 Deficiency of other specified B group vitamins: Secondary | ICD-10-CM | POA: Diagnosis not present

## 2019-06-22 DIAGNOSIS — R739 Hyperglycemia, unspecified: Secondary | ICD-10-CM | POA: Diagnosis not present

## 2019-06-22 DIAGNOSIS — E559 Vitamin D deficiency, unspecified: Secondary | ICD-10-CM | POA: Diagnosis not present

## 2019-06-22 DIAGNOSIS — Z9884 Bariatric surgery status: Secondary | ICD-10-CM

## 2019-06-22 DIAGNOSIS — K219 Gastro-esophageal reflux disease without esophagitis: Secondary | ICD-10-CM | POA: Diagnosis not present

## 2019-06-22 DIAGNOSIS — I10 Essential (primary) hypertension: Secondary | ICD-10-CM | POA: Diagnosis not present

## 2019-06-22 DIAGNOSIS — D508 Other iron deficiency anemias: Secondary | ICD-10-CM

## 2019-06-22 NOTE — Patient Instructions (Signed)
Health Maintenance, Female Adopting a healthy lifestyle and getting preventive care are important in promoting health and wellness. Ask your health care provider about:  The right schedule for you to have regular tests and exams.  Things you can do on your own to prevent diseases and keep yourself healthy. What should I know about diet, weight, and exercise? Eat a healthy diet   Eat a diet that includes plenty of vegetables, fruits, low-fat dairy products, and lean protein.  Do not eat a lot of foods that are high in solid fats, added sugars, or sodium. Maintain a healthy weight Body mass index (BMI) is used to identify weight problems. It estimates body fat based on height and weight. Your health care provider can help determine your BMI and help you achieve or maintain a healthy weight. Get regular exercise Get regular exercise. This is one of the most important things you can do for your health. Most adults should:  Exercise for at least 150 minutes each week. The exercise should increase your heart rate and make you sweat (moderate-intensity exercise).  Do strengthening exercises at least twice a week. This is in addition to the moderate-intensity exercise.  Spend less time sitting. Even light physical activity can be beneficial. Watch cholesterol and blood lipids Have your blood tested for lipids and cholesterol at 45 years of age, then have this test every 5 years. Have your cholesterol levels checked more often if:  Your lipid or cholesterol levels are high.  You are older than 45 years of age.  You are at high risk for heart disease. What should I know about cancer screening? Depending on your health history and family history, you may need to have cancer screening at various ages. This may include screening for:  Breast cancer.  Cervical cancer.  Colorectal cancer.  Skin cancer.  Lung cancer. What should I know about heart disease, diabetes, and high blood  pressure? Blood pressure and heart disease  High blood pressure causes heart disease and increases the risk of stroke. This is more likely to develop in people who have high blood pressure readings, are of African descent, or are overweight.  Have your blood pressure checked: ? Every 3-5 years if you are 18-39 years of age. ? Every year if you are 40 years old or older. Diabetes Have regular diabetes screenings. This checks your fasting blood sugar level. Have the screening done:  Once every three years after age 40 if you are at a normal weight and have a low risk for diabetes.  More often and at a younger age if you are overweight or have a high risk for diabetes. What should I know about preventing infection? Hepatitis B If you have a higher risk for hepatitis B, you should be screened for this virus. Talk with your health care provider to find out if you are at risk for hepatitis B infection. Hepatitis C Testing is recommended for:  Everyone born from 1945 through 1965.  Anyone with known risk factors for hepatitis C. Sexually transmitted infections (STIs)  Get screened for STIs, including gonorrhea and chlamydia, if: ? You are sexually active and are younger than 45 years of age. ? You are older than 45 years of age and your health care provider tells you that you are at risk for this type of infection. ? Your sexual activity has changed since you were last screened, and you are at increased risk for chlamydia or gonorrhea. Ask your health care provider if   you are at risk.  Ask your health care provider about whether you are at high risk for HIV. Your health care provider may recommend a prescription medicine to help prevent HIV infection. If you choose to take medicine to prevent HIV, you should first get tested for HIV. You should then be tested every 3 months for as long as you are taking the medicine. Pregnancy  If you are about to stop having your period (premenopausal) and  you may become pregnant, seek counseling before you get pregnant.  Take 400 to 800 micrograms (mcg) of folic acid every day if you become pregnant.  Ask for birth control (contraception) if you want to prevent pregnancy. Osteoporosis and menopause Osteoporosis is a disease in which the bones lose minerals and strength with aging. This can result in bone fractures. If you are 65 years old or older, or if you are at risk for osteoporosis and fractures, ask your health care provider if you should:  Be screened for bone loss.  Take a calcium or vitamin D supplement to lower your risk of fractures.  Be given hormone replacement therapy (HRT) to treat symptoms of menopause. Follow these instructions at home: Lifestyle  Do not use any products that contain nicotine or tobacco, such as cigarettes, e-cigarettes, and chewing tobacco. If you need help quitting, ask your health care provider.  Do not use street drugs.  Do not share needles.  Ask your health care provider for help if you need support or information about quitting drugs. Alcohol use  Do not drink alcohol if: ? Your health care provider tells you not to drink. ? You are pregnant, may be pregnant, or are planning to become pregnant.  If you drink alcohol: ? Limit how much you use to 0-1 drink a day. ? Limit intake if you are breastfeeding.  Be aware of how much alcohol is in your drink. In the [place] one drink equals one 12 oz bottle of beer (355 mL), one 5 oz glass of wine (148 mL), or one 1 oz glass of hard liquor (44 mL). General instructions  Schedule regular health, dental, and eye exams.  Stay current with your vaccines.  Tell your health care provider if: ? You often feel depressed. ? You have ever been abused or do not feel safe at home. Summary  Adopting a healthy lifestyle and getting preventive care are important in promoting health and wellness.  Follow your health care provider's instructions about healthy  diet, exercising, and getting tested or screened for diseases.  Follow your health care provider's instructions on monitoring your cholesterol and blood pressure. This information is not intended to replace advice given to you by your health care provider. Make sure you discuss any questions you have with your health care provider. Document Released: 04/09/2011 Document Revised: 09/17/2018 Document Reviewed: 09/17/2018 [employer] Patient Education  2020 [employer]  
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
## 2019-06-23 ENCOUNTER — Telehealth: Payer: Self-pay | Admitting: *Deleted

## 2019-06-23 LAB — LIPID PANEL
Chol/HDL Ratio: 1.9 ratio (ref 0.0–4.4)
Cholesterol, Total: 153 mg/dL (ref 100–199)
HDL: 82 mg/dL (ref >39)
LDL Chol Calc (NIH): 54 mg/dL (ref 0–99)
Triglycerides: 97 mg/dL (ref 0–149)
VLDL Cholesterol Cal: 17 mg/dL (ref 5–40)

## 2019-06-23 LAB — CBC WITH DIFFERENTIAL/PLATELET
Basophils Absolute: 0.1 10*3/uL (ref 0.0–0.2)
Basos: 1 %
EOS (ABSOLUTE): 0.1 10*3/uL (ref 0.0–0.4)
Eos: 2 %
Hematocrit: 40.8 % (ref 34.0–46.6)
Hemoglobin: 13.2 g/dL (ref 11.1–15.9)
Immature Grans (Abs): 0 10*3/uL (ref 0.0–0.1)
Immature Granulocytes: 0 %
Lymphocytes Absolute: 1.5 10*3/uL (ref 0.7–3.1)
Lymphs: 32 %
MCH: 29.1 pg (ref 26.6–33.0)
MCHC: 32.4 g/dL (ref 31.5–35.7)
MCV: 90 fL (ref 79–97)
Monocytes Absolute: 0.4 10*3/uL (ref 0.1–0.9)
Monocytes: 8 %
Neutrophils Absolute: 2.7 10*3/uL (ref 1.4–7.0)
Neutrophils: 57 %
Platelets: 227 10*3/uL (ref 150–450)
RBC: 4.54 x10E6/uL (ref 3.77–5.28)
RDW: 12.9 % (ref 11.7–15.4)
WBC: 4.7 10*3/uL (ref 3.4–10.8)

## 2019-06-23 LAB — COMPREHENSIVE METABOLIC PANEL
ALT: 13 IU/L (ref 0–32)
AST: 17 IU/L (ref 0–40)
Albumin/Globulin Ratio: 2 (ref 1.2–2.2)
Albumin: 4 g/dL (ref 3.8–4.8)
Alkaline Phosphatase: 89 IU/L (ref 39–117)
BUN/Creatinine Ratio: 29 — ABNORMAL HIGH (ref 9–23)
BUN: 15 mg/dL (ref 6–24)
Bilirubin Total: <0.2 mg/dL (ref 0.0–1.2)
CO2: 25 mmol/L (ref 20–29)
Calcium: 9.5 mg/dL (ref 8.7–10.2)
Chloride: 101 mmol/L (ref 96–106)
Creatinine, Ser: 0.52 mg/dL — ABNORMAL LOW (ref 0.57–1.00)
GFR calc Af Amer: 133 mL/min/{1.73_m2} (ref >59)
GFR calc non Af Amer: 116 mL/min/{1.73_m2} (ref >59)
Globulin, Total: 2 g/dL (ref 1.5–4.5)
Glucose: 75 mg/dL (ref 65–99)
Potassium: 4.2 mmol/L (ref 3.5–5.2)
Sodium: 138 mmol/L (ref 134–144)
Total Protein: 6 g/dL (ref 6.0–8.5)

## 2019-06-23 LAB — HIV ANTIBODY (ROUTINE TESTING W REFLEX): HIV Screen 4th Generation wRfx: NONREACTIVE

## 2019-06-23 LAB — VITAMIN B12: Vitamin B-12: 1069 pg/mL (ref 232–1245)

## 2019-06-23 LAB — HEMOGLOBIN A1C
Est. average glucose Bld gHb Est-mCnc: 88 mg/dL
Hgb A1c MFr Bld: 4.7 % — ABNORMAL LOW (ref 4.8–5.6)

## 2019-06-23 LAB — TSH: TSH: 2.29 u[IU]/mL (ref 0.450–4.500)

## 2019-06-23 LAB — VITAMIN D 25 HYDROXY (VIT D DEFICIENCY, FRACTURES): Vit D, 25-Hydroxy: 52.9 ng/mL (ref 30.0–100.0)

## 2019-06-23 NOTE — Telephone Encounter (Signed)
LMOVM for pt to return call 

## 2019-06-23 NOTE — Telephone Encounter (Signed)
-----   Message from Mar Daring, [place] sent at 06/23/2019  1:11 PM EDT ----- Blood count is normal. Kidney and liver function are normal. Sodium, potassium and calcium are normal. A1c is borderline low. May have some hypoglycemic episodes. Make sure to keep a little snack around in case this occurs. Cholesterol is great. Thyroid is normal. Vit D is normal. B12 is normal. HIV screen done once in a lifetime, unless exposed,  is negative.

## 2019-06-24 NOTE — Telephone Encounter (Signed)
Viewed by Osa Craver on 06/23/2019 1:20 PM

## 2019-07-24 ENCOUNTER — Ambulatory Visit (INDEPENDENT_AMBULATORY_CARE_PROVIDER_SITE_OTHER): Payer: BC Managed Care – PPO | Admitting: Physician Assistant

## 2019-07-24 ENCOUNTER — Other Ambulatory Visit: Payer: Self-pay

## 2019-07-24 DIAGNOSIS — Z23 Encounter for immunization: Secondary | ICD-10-CM

## 2019-07-24 DIAGNOSIS — E119 Type 2 diabetes mellitus without complications: Secondary | ICD-10-CM | POA: Diagnosis not present

## 2019-07-24 DIAGNOSIS — Z8701 Personal history of pneumonia (recurrent): Secondary | ICD-10-CM | POA: Diagnosis not present
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
## 2019-08-10 ENCOUNTER — Ambulatory Visit (INDEPENDENT_AMBULATORY_CARE_PROVIDER_SITE_OTHER): Payer: BC Managed Care – PPO | Admitting: Obstetrics and Gynecology

## 2019-08-10 ENCOUNTER — Other Ambulatory Visit: Payer: Self-pay

## 2019-08-10 ENCOUNTER — Encounter: Payer: Self-pay | Admitting: Obstetrics and Gynecology

## 2019-08-10 VITALS — BP 104/80 | Ht 62.5 in | Wt 173.0 lb

## 2019-08-10 DIAGNOSIS — Z01419 Encounter for gynecological examination (general) (routine) without abnormal findings: Secondary | ICD-10-CM

## 2019-08-10 DIAGNOSIS — Z1231 Encounter for screening mammogram for malignant neoplasm of breast: Secondary | ICD-10-CM

## 2019-08-10 NOTE — Patient Instructions (Addendum)
I value your feedback and entrusting [place] with your care. If you get a [place] patient survey, I would appreciate you taking the time to let us know about your experience today. Thank you!  [place] at [facility]: [phone]

## 2019-08-24 DIAGNOSIS — M8700 Idiopathic aseptic necrosis of unspecified bone: Secondary | ICD-10-CM | POA: Diagnosis not present

## 2019-08-31 DIAGNOSIS — M79671 Pain in right foot: Secondary | ICD-10-CM | POA: Diagnosis not present

## 2019-08-31 DIAGNOSIS — S92111K Displaced fracture of neck of right talus, subsequent encounter for fracture with nonunion: Secondary | ICD-10-CM | POA: Diagnosis not present

## 2019-08-31 DIAGNOSIS — Z981 Arthrodesis status: Secondary | ICD-10-CM | POA: Diagnosis not present

## 2019-09-07 ENCOUNTER — Ambulatory Visit
Admission: RE | Admit: 2019-09-07 | Discharge: 2019-09-07 | Disposition: A | Discharge status: Home / Self Care | Payer: BC Managed Care – PPO | Source: Ambulatory Visit | Attending: Obstetrics and Gynecology | Admitting: Obstetrics and Gynecology

## 2019-09-07 DIAGNOSIS — Z1231 Encounter for screening mammogram for malignant neoplasm of breast: Secondary | ICD-10-CM | POA: Diagnosis not present

## 2019-09-08 ENCOUNTER — Other Ambulatory Visit: Payer: Self-pay | Admitting: Obstetrics and Gynecology

## 2019-09-08 DIAGNOSIS — R928 Other abnormal and inconclusive findings on diagnostic imaging of breast: Secondary | ICD-10-CM

## 2019-09-08 DIAGNOSIS — N6489 Other specified disorders of breast: Secondary | ICD-10-CM

## 2019-09-16 ENCOUNTER — Ambulatory Visit
Admission: RE | Admit: 2019-09-16 | Discharge: 2019-09-16 | Disposition: A | Discharge status: Home / Self Care | Payer: BC Managed Care – PPO | Source: Ambulatory Visit | Attending: Obstetrics and Gynecology | Admitting: Obstetrics and Gynecology

## 2019-09-16 ENCOUNTER — Encounter: Payer: Self-pay | Admitting: Obstetrics and Gynecology

## 2019-09-16 DIAGNOSIS — N6489 Other specified disorders of breast: Secondary | ICD-10-CM | POA: Diagnosis not present

## 2019-09-16 DIAGNOSIS — R928 Other abnormal and inconclusive findings on diagnostic imaging of breast: Secondary | ICD-10-CM

## 2019-09-16 DIAGNOSIS — R922 Inconclusive mammogram: Secondary | ICD-10-CM | POA: Diagnosis not present

## 2019-09-27 ENCOUNTER — Encounter: Payer: Self-pay | Admitting: Physician Assistant

## 2019-09-27 DIAGNOSIS — K219 Gastro-esophageal reflux disease without esophagitis: Secondary | ICD-10-CM

## 2019-09-28 MED ORDER — DEXLANSOPRAZOLE 60 MG PO CPDR: 60.0000 mg | DELAYED_RELEASE_CAPSULE | Freq: Every day | ORAL | 3 refills | Status: DC

## 2019-10-05 DIAGNOSIS — M79671 Pain in right foot: Secondary | ICD-10-CM | POA: Diagnosis not present

## 2019-11-03 ENCOUNTER — Encounter: Payer: Self-pay | Admitting: Physician Assistant

## 2019-11-09 DIAGNOSIS — Z9884 Bariatric surgery status: Secondary | ICD-10-CM | POA: Diagnosis not present

## 2019-11-09 DIAGNOSIS — R1013 Epigastric pain: Secondary | ICD-10-CM | POA: Diagnosis not present

## 2019-11-09 DIAGNOSIS — K219 Gastro-esophageal reflux disease without esophagitis: Secondary | ICD-10-CM | POA: Diagnosis not present

## 2019-11-11 DIAGNOSIS — R1013 Epigastric pain: Secondary | ICD-10-CM | POA: Diagnosis not present

## 2019-11-11 DIAGNOSIS — Z6829 Body mass index (BMI) 29.0-29.9, adult: Secondary | ICD-10-CM | POA: Diagnosis not present

## 2019-11-11 DIAGNOSIS — Z9884 Bariatric surgery status: Secondary | ICD-10-CM | POA: Diagnosis not present

## 2019-11-11 DIAGNOSIS — K219 Gastro-esophageal reflux disease without esophagitis: Secondary | ICD-10-CM | POA: Diagnosis not present

## 2019-11-13 DIAGNOSIS — Z03818 Encounter for observation for suspected exposure to other biological agents ruled out: Secondary | ICD-10-CM | POA: Diagnosis not present

## 2019-12-01 DIAGNOSIS — Z20822 Contact with and (suspected) exposure to covid-19: Secondary | ICD-10-CM | POA: Diagnosis not present

## 2019-12-01 DIAGNOSIS — Z01818 Encounter for other preprocedural examination: Secondary | ICD-10-CM | POA: Diagnosis not present

## 2019-12-01 DIAGNOSIS — Z03818 Encounter for observation for suspected exposure to other biological agents ruled out: Secondary | ICD-10-CM | POA: Diagnosis not present

## 2019-12-04 DIAGNOSIS — Z7952 Long term (current) use of systemic steroids: Secondary | ICD-10-CM | POA: Diagnosis not present

## 2019-12-04 DIAGNOSIS — Z79899 Other long term (current) drug therapy: Secondary | ICD-10-CM | POA: Diagnosis not present

## 2019-12-04 DIAGNOSIS — K432 Incisional hernia without obstruction or gangrene: Secondary | ICD-10-CM | POA: Diagnosis not present

## 2019-12-04 DIAGNOSIS — R1084 Generalized abdominal pain: Secondary | ICD-10-CM | POA: Diagnosis not present

## 2019-12-04 DIAGNOSIS — G8918 Other acute postprocedural pain: Secondary | ICD-10-CM | POA: Diagnosis not present

## 2019-12-04 DIAGNOSIS — K219 Gastro-esophageal reflux disease without esophagitis: Secondary | ICD-10-CM | POA: Diagnosis not present

## 2019-12-04 DIAGNOSIS — Z9884 Bariatric surgery status: Secondary | ICD-10-CM | POA: Diagnosis not present

## 2019-12-04 DIAGNOSIS — K449 Diaphragmatic hernia without obstruction or gangrene: Secondary | ICD-10-CM | POA: Diagnosis not present

## 2019-12-22 DIAGNOSIS — Z8719 Personal history of other diseases of the digestive system: Secondary | ICD-10-CM | POA: Diagnosis not present

## 2019-12-22 DIAGNOSIS — K439 Ventral hernia without obstruction or gangrene: Secondary | ICD-10-CM | POA: Diagnosis not present

## 2019-12-22 DIAGNOSIS — Z9889 Other specified postprocedural states: Secondary | ICD-10-CM | POA: Diagnosis not present

## 2020-01-04 ENCOUNTER — Ambulatory Visit (INDEPENDENT_AMBULATORY_CARE_PROVIDER_SITE_OTHER): Payer: Self-pay

## 2020-01-04 ENCOUNTER — Other Ambulatory Visit: Payer: Self-pay

## 2020-01-04 DIAGNOSIS — L680 Hirsutism: Secondary | ICD-10-CM

## 2020-01-28 ENCOUNTER — Telehealth: Payer: Self-pay

## 2020-01-28 NOTE — Telephone Encounter (Signed)
She can just drop off the form

## 2020-01-28 NOTE — Telephone Encounter (Signed)
Patient advised that she can just drop off the form and she stated she will drop it off tomorrow.

## 2020-01-28 NOTE — Telephone Encounter (Signed)
LMTCB, [facility] Triage Nurse may give patient results  

## 2020-01-28 NOTE — Telephone Encounter (Signed)
Copied from [place] [id number]. Topic: General - Other >> Jan 28, 2020  8:05 AM Oneta Rack wrote: Reason for CRM: patient received a DMV Renewal handicap placard  form and would like to know if an appt is required or cam she just drop off the form. Please advise

## 2020-02-06 ENCOUNTER — Emergency Department
Admission: EM | Admit: 2020-02-06 | Discharge: 2020-02-06 | Disposition: A | Discharge status: Home / Self Care | Payer: BC Managed Care – PPO | Attending: Emergency Medicine | Admitting: Emergency Medicine

## 2020-02-06 ENCOUNTER — Other Ambulatory Visit: Payer: Self-pay

## 2020-02-06 ENCOUNTER — Emergency Department: Payer: BC Managed Care – PPO

## 2020-02-06 DIAGNOSIS — R109 Unspecified abdominal pain: Secondary | ICD-10-CM | POA: Diagnosis not present

## 2020-02-06 DIAGNOSIS — R1012 Left upper quadrant pain: Secondary | ICD-10-CM | POA: Insufficient documentation

## 2020-02-06 DIAGNOSIS — Z79899 Other long term (current) drug therapy: Secondary | ICD-10-CM | POA: Insufficient documentation

## 2020-02-06 DIAGNOSIS — R1013 Epigastric pain: Secondary | ICD-10-CM | POA: Diagnosis not present

## 2020-02-06 DIAGNOSIS — R110 Nausea: Secondary | ICD-10-CM | POA: Diagnosis not present

## 2020-02-06 LAB — URINALYSIS, COMPLETE (UACMP) WITH MICROSCOPIC
Bilirubin Urine: NEGATIVE
Glucose, UA: NEGATIVE mg/dL
Hgb urine dipstick: NEGATIVE
Ketones, ur: NEGATIVE mg/dL
Leukocytes,Ua: NEGATIVE
Nitrite: NEGATIVE
Protein, ur: NEGATIVE mg/dL
Specific Gravity, Urine: 1.019 (ref 1.005–1.030)
pH: 5 (ref 5.0–8.0)

## 2020-02-06 LAB — COMPREHENSIVE METABOLIC PANEL
ALT: 16 U/L (ref 0–44)
AST: 22 U/L (ref 15–41)
Albumin: 4 g/dL (ref 3.5–5.0)
Alkaline Phosphatase: 76 U/L (ref 38–126)
Anion gap: 5 (ref 5–15)
BUN: 11 mg/dL (ref 6–20)
CO2: 23 mmol/L (ref 22–32)
Calcium: 9.4 mg/dL (ref 8.9–10.3)
Chloride: 109 mmol/L (ref 98–111)
Creatinine, Ser: 0.54 mg/dL (ref 0.44–1.00)
GFR calc Af Amer: >60 mL/min (ref >60)
GFR calc non Af Amer: >60 mL/min (ref >60)
Glucose, Bld: 96 mg/dL (ref 70–99)
Potassium: 3.7 mmol/L (ref 3.5–5.1)
Sodium: 137 mmol/L (ref 135–145)
Total Bilirubin: 0.5 mg/dL (ref 0.3–1.2)
Total Protein: 7.1 g/dL (ref 6.5–8.1)

## 2020-02-06 LAB — LIPASE, BLOOD: Lipase: 28 U/L (ref 11–51)

## 2020-02-06 LAB — CBC
HCT: 42.5 % (ref 36.0–46.0)
Hemoglobin: 14.3 g/dL (ref 12.0–15.0)
MCH: 29.4 pg (ref 26.0–34.0)
MCHC: 33.6 g/dL (ref 30.0–36.0)
MCV: 87.4 fL (ref 80.0–100.0)
Platelets: 270 10*3/uL (ref 150–400)
RBC: 4.86 MIL/uL (ref 3.87–5.11)
RDW: 12.4 % (ref 11.5–15.5)
WBC: 7.1 10*3/uL (ref 4.0–10.5)
nRBC: 0 % (ref 0.0–0.2)

## 2020-02-06 MED ORDER — ONDANSETRON 4 MG PO TBDP: 4.0000 mg | ORAL_TABLET | Freq: Once | ORAL | Status: DC

## 2020-02-06 MED ORDER — DICYCLOMINE HCL 10 MG PO CAPS
20.0000 mg | ORAL_CAPSULE | Freq: Once | ORAL | Status: AC
  Administered 2020-02-06: 20 mg via ORAL
  Filled 2020-02-06: qty 2

## 2020-02-06 MED ORDER — IOHEXOL 300 MG/ML  SOLN
100.0000 mL | Freq: Once | INTRAMUSCULAR | Status: AC | PRN
  Administered 2020-02-06: 100 mL via INTRAVENOUS

## 2020-02-06 MED ORDER — ONDANSETRON HCL 4 MG/2ML IJ SOLN
4.0000 mg | Freq: Once | INTRAMUSCULAR | Status: AC
  Administered 2020-02-06: 4 mg via INTRAVENOUS
  Filled 2020-02-06: qty 2

## 2020-02-06 MED ORDER — DICYCLOMINE HCL 10 MG PO CAPS: 10.0000 mg | ORAL_CAPSULE | Freq: Four times a day (QID) | ORAL | 0 refills | Status: DC

## 2020-02-06 MED ORDER — ONDANSETRON HCL 4 MG/2ML IJ SOLN: 4.0000 mg | Freq: Once | INTRAMUSCULAR | Status: DC

## 2020-02-06 NOTE — Discharge Instructions (Addendum)
Your exam, labs, and CT scan are normal and reassuring at this time. You do not have any signs of a bowel obstruction, incarcerated hernia, or diverticulitis. 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 This should resolve without intervention. Follow-up with your primary provider or bariatric surgeon for continued symptoms.
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
## 2020-02-06 NOTE — ED Notes (Signed)
See triage note. Pt report LUQ pain that has been intermittent for the past few weeks.  Hx bariatric surgery and hernia surgery on the left side.  Reports nausea today. Denies diarrhea, fevers, CP.  Reports BM today.

## 2020-02-06 NOTE — ED Triage Notes (Signed)
Pt presents to ER with LUQ pain, states bariatric surgery 3 years ago and pt states known hernia on L side. States this pain occurred a few weeks ago. Nausea but unable to vomit. Denies diarrhea. BM today. Thinks she has a bowel blockage. A&O, ambulatory.

## 2020-02-08 ENCOUNTER — Ambulatory Visit: Payer: Self-pay

## 2020-02-09 ENCOUNTER — Ambulatory Visit (INDEPENDENT_AMBULATORY_CARE_PROVIDER_SITE_OTHER): Payer: Self-pay

## 2020-02-09 ENCOUNTER — Encounter: Payer: Self-pay | Admitting: Physician Assistant

## 2020-02-09 ENCOUNTER — Other Ambulatory Visit: Payer: Self-pay

## 2020-02-09 DIAGNOSIS — L680 Hirsutism: Secondary | ICD-10-CM

## 2020-02-09 DIAGNOSIS — R1012 Left upper quadrant pain: Secondary | ICD-10-CM

## 2020-02-09 NOTE — Telephone Encounter (Signed)
I pulled the referral for GI. I think I got everything in

## 2020-03-17 ENCOUNTER — Encounter: Payer: Self-pay | Admitting: Gastroenterology

## 2020-03-17 ENCOUNTER — Other Ambulatory Visit: Payer: Self-pay

## 2020-03-17 ENCOUNTER — Ambulatory Visit: Payer: BC Managed Care – PPO | Admitting: Gastroenterology

## 2020-03-17 VITALS — BP 122/82 | HR 67 | Temp 98.3°F | Wt 176.2 lb

## 2020-03-17 DIAGNOSIS — R109 Unspecified abdominal pain: Secondary | ICD-10-CM | POA: Diagnosis not present

## 2020-03-17 NOTE — Progress Notes (Signed)
I spoke with Sarah from Dr. Synthia Innocent bariatric team as she called me back from [facility] after I paged Dr. Darnell Level. She remembers this patient. I discussed the nature of her symptoms with her, and she states that she will follow up with the patient to have her come into clinic to discuss ventral hernia repair. She will try to obtain images from her recent CT scan. She states her symptoms may be coming from her ventral hernia, or other he rnias she may have developed after her surgery. I called the patient and informed her of this discussion. I have asked her to call their clinic to set up an appointment next week if she does not hear from them by then. I have also asked her to stop by [facility] and request a CD of her CT images. I have asked her to contact us with any questions or if she has any issues with obtaining a CD of her CT images.
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
## 2020-04-20 ENCOUNTER — Telehealth: Payer: BC Managed Care – PPO | Admitting: Gastroenterology

## 2020-04-20 DIAGNOSIS — R1013 Epigastric pain: Secondary | ICD-10-CM | POA: Diagnosis not present

## 2020-05-11 DIAGNOSIS — Z20822 Contact with and (suspected) exposure to covid-19: Secondary | ICD-10-CM | POA: Diagnosis not present

## 2020-05-11 DIAGNOSIS — Z01818 Encounter for other preprocedural examination: Secondary | ICD-10-CM | POA: Diagnosis not present

## 2020-05-13 DIAGNOSIS — K439 Ventral hernia without obstruction or gangrene: Secondary | ICD-10-CM | POA: Diagnosis not present

## 2020-05-13 DIAGNOSIS — Z79899 Other long term (current) drug therapy: Secondary | ICD-10-CM | POA: Diagnosis not present

## 2020-05-13 DIAGNOSIS — K458 Other specified abdominal hernia without obstruction or gangrene: Secondary | ICD-10-CM | POA: Diagnosis not present

## 2020-05-13 DIAGNOSIS — R109 Unspecified abdominal pain: Secondary | ICD-10-CM | POA: Diagnosis not present

## 2020-05-13 DIAGNOSIS — Z9884 Bariatric surgery status: Secondary | ICD-10-CM | POA: Diagnosis not present

## 2020-05-13 DIAGNOSIS — K219 Gastro-esophageal reflux disease without esophagitis: Secondary | ICD-10-CM | POA: Diagnosis not present

## 2020-05-13 DIAGNOSIS — K562 Volvulus: Secondary | ICD-10-CM | POA: Diagnosis not present

## 2020-07-15 ENCOUNTER — Other Ambulatory Visit: Payer: Self-pay | Admitting: Physician Assistant

## 2020-07-15 DIAGNOSIS — K219 Gastro-esophageal reflux disease without esophagitis: Secondary | ICD-10-CM

## 2020-08-07 ENCOUNTER — Other Ambulatory Visit: Payer: Self-pay | Admitting: Physician Assistant

## 2020-08-07 DIAGNOSIS — K219 Gastro-esophageal reflux disease without esophagitis: Secondary | ICD-10-CM

## 2020-08-07 NOTE — Telephone Encounter (Signed)
Requested medications are due for refill today yes  Requested medications are on the active medication list yes  Last refill 10/8  Future visit scheduled no  Notes to clinic Has already had a curtesy refill and has not been seen and there is not an upcoming appt scheduled.
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
## 2020-08-11 ENCOUNTER — Other Ambulatory Visit: Payer: Self-pay

## 2020-08-11 ENCOUNTER — Ambulatory Visit (INDEPENDENT_AMBULATORY_CARE_PROVIDER_SITE_OTHER): Payer: BC Managed Care – PPO | Admitting: Obstetrics and Gynecology

## 2020-08-11 ENCOUNTER — Encounter: Payer: Self-pay | Admitting: Obstetrics and Gynecology

## 2020-08-11 VITALS — BP 110/80 | Ht 62.5 in | Wt 175.0 lb

## 2020-08-11 DIAGNOSIS — Z1231 Encounter for screening mammogram for malignant neoplasm of breast: Secondary | ICD-10-CM

## 2020-08-11 DIAGNOSIS — Z01419 Encounter for gynecological examination (general) (routine) without abnormal findings: Secondary | ICD-10-CM | POA: Diagnosis not present

## 2020-08-11 DIAGNOSIS — Z1211 Encounter for screening for malignant neoplasm of colon: Secondary | ICD-10-CM | POA: Diagnosis not present

## 2020-08-11 NOTE — Patient Instructions (Signed)
I value your feedback and entrusting [place] with your care. If you get a [facility] patient survey, I would appreciate you taking the time to let us know about your experience today. Thank you!  As of September 17, 2019, your lab results will be released to your MyChart immediately, before I even have a chance to see them. Please give me time to review them and contact you if there are any abnormalities. Thank you for your patience.   [place] [facility]: [phone]

## 2020-08-18 DIAGNOSIS — K6389 Other specified diseases of intestine: Secondary | ICD-10-CM | POA: Diagnosis not present

## 2020-08-18 DIAGNOSIS — Z9484 Stem cells transplant status: Secondary | ICD-10-CM | POA: Diagnosis not present

## 2020-08-18 DIAGNOSIS — Z7901 Long term (current) use of anticoagulants: Secondary | ICD-10-CM | POA: Diagnosis not present

## 2020-08-18 DIAGNOSIS — Z9884 Bariatric surgery status: Secondary | ICD-10-CM | POA: Diagnosis not present

## 2020-08-18 DIAGNOSIS — D259 Leiomyoma of uterus, unspecified: Secondary | ICD-10-CM | POA: Diagnosis not present

## 2020-08-18 DIAGNOSIS — G8929 Other chronic pain: Secondary | ICD-10-CM | POA: Diagnosis not present

## 2020-08-18 DIAGNOSIS — R519 Headache, unspecified: Secondary | ICD-10-CM | POA: Diagnosis not present

## 2020-08-18 DIAGNOSIS — Z20822 Contact with and (suspected) exposure to covid-19: Secondary | ICD-10-CM | POA: Diagnosis not present

## 2020-08-18 DIAGNOSIS — K449 Diaphragmatic hernia without obstruction or gangrene: Secondary | ICD-10-CM | POA: Diagnosis not present

## 2020-08-18 DIAGNOSIS — Z9081 Acquired absence of spleen: Secondary | ICD-10-CM | POA: Diagnosis not present

## 2020-08-18 DIAGNOSIS — R7303 Prediabetes: Secondary | ICD-10-CM | POA: Diagnosis not present

## 2020-08-18 DIAGNOSIS — R197 Diarrhea, unspecified: Secondary | ICD-10-CM | POA: Diagnosis not present

## 2020-08-18 DIAGNOSIS — R1013 Epigastric pain: Secondary | ICD-10-CM | POA: Diagnosis not present

## 2020-08-18 DIAGNOSIS — R1110 Vomiting, unspecified: Secondary | ICD-10-CM | POA: Diagnosis not present

## 2020-08-18 DIAGNOSIS — I10 Essential (primary) hypertension: Secondary | ICD-10-CM | POA: Diagnosis not present

## 2020-08-18 DIAGNOSIS — R110 Nausea: Secondary | ICD-10-CM | POA: Diagnosis not present

## 2020-08-18 DIAGNOSIS — R109 Unspecified abdominal pain: Secondary | ICD-10-CM | POA: Diagnosis not present

## 2020-08-18 DIAGNOSIS — R112 Nausea with vomiting, unspecified: Secondary | ICD-10-CM | POA: Diagnosis not present

## 2020-08-18 DIAGNOSIS — Z8719 Personal history of other diseases of the digestive system: Secondary | ICD-10-CM | POA: Diagnosis not present

## 2020-08-21 ENCOUNTER — Encounter: Payer: Self-pay | Admitting: Emergency Medicine

## 2020-08-21 ENCOUNTER — Encounter
Admission: EM | Disposition: A | Discharge status: Home / Self Care | Payer: Self-pay | Source: Home / Self Care | Attending: Surgery

## 2020-08-21 ENCOUNTER — Emergency Department: Payer: BC Managed Care – PPO

## 2020-08-21 ENCOUNTER — Emergency Department: Payer: BC Managed Care – PPO | Admitting: Anesthesiology

## 2020-08-21 ENCOUNTER — Inpatient Hospital Stay
Admission: EM | Admit: 2020-08-21 | Discharge: 2020-08-24 | DRG: 335 | Disposition: A | Discharge status: Home / Self Care | Payer: BC Managed Care – PPO | Attending: Surgery | Admitting: Surgery

## 2020-08-21 ENCOUNTER — Other Ambulatory Visit: Payer: Self-pay

## 2020-08-21 DIAGNOSIS — I10 Essential (primary) hypertension: Secondary | ICD-10-CM | POA: Diagnosis not present

## 2020-08-21 DIAGNOSIS — K219 Gastro-esophageal reflux disease without esophagitis: Secondary | ICD-10-CM | POA: Diagnosis not present

## 2020-08-21 DIAGNOSIS — Z833 Family history of diabetes mellitus: Secondary | ICD-10-CM

## 2020-08-21 DIAGNOSIS — K859 Acute pancreatitis without necrosis or infection, unspecified: Secondary | ICD-10-CM | POA: Diagnosis present

## 2020-08-21 DIAGNOSIS — Z9884 Bariatric surgery status: Secondary | ICD-10-CM

## 2020-08-21 DIAGNOSIS — R1110 Vomiting, unspecified: Secondary | ICD-10-CM | POA: Diagnosis not present

## 2020-08-21 DIAGNOSIS — K56609 Unspecified intestinal obstruction, unspecified as to partial versus complete obstruction: Secondary | ICD-10-CM

## 2020-08-21 DIAGNOSIS — Z8041 Family history of malignant neoplasm of ovary: Secondary | ICD-10-CM | POA: Diagnosis not present

## 2020-08-21 DIAGNOSIS — R079 Chest pain, unspecified: Secondary | ICD-10-CM | POA: Diagnosis not present

## 2020-08-21 DIAGNOSIS — K565 Intestinal adhesions [bands], unspecified as to partial versus complete obstruction: Principal | ICD-10-CM | POA: Diagnosis present

## 2020-08-21 DIAGNOSIS — Z8249 Family history of ischemic heart disease and other diseases of the circulatory system: Secondary | ICD-10-CM

## 2020-08-21 DIAGNOSIS — E282 Polycystic ovarian syndrome: Secondary | ICD-10-CM | POA: Diagnosis not present

## 2020-08-21 DIAGNOSIS — Z803 Family history of malignant neoplasm of breast: Secondary | ICD-10-CM

## 2020-08-21 DIAGNOSIS — Z20822 Contact with and (suspected) exposure to covid-19: Secondary | ICD-10-CM | POA: Diagnosis not present

## 2020-08-21 DIAGNOSIS — G902 Horner's syndrome: Secondary | ICD-10-CM | POA: Diagnosis not present

## 2020-08-21 DIAGNOSIS — R112 Nausea with vomiting, unspecified: Secondary | ICD-10-CM | POA: Diagnosis not present

## 2020-08-21 DIAGNOSIS — K56699 Other intestinal obstruction unspecified as to partial versus complete obstruction: Secondary | ICD-10-CM | POA: Diagnosis not present

## 2020-08-21 DIAGNOSIS — F419 Anxiety disorder, unspecified: Secondary | ICD-10-CM | POA: Diagnosis not present

## 2020-08-21 DIAGNOSIS — R1084 Generalized abdominal pain: Secondary | ICD-10-CM

## 2020-08-21 DIAGNOSIS — Z79899 Other long term (current) drug therapy: Secondary | ICD-10-CM | POA: Diagnosis not present

## 2020-08-21 DIAGNOSIS — K660 Peritoneal adhesions (postprocedural) (postinfection): Secondary | ICD-10-CM | POA: Diagnosis not present

## 2020-08-21 DIAGNOSIS — R109 Unspecified abdominal pain: Secondary | ICD-10-CM | POA: Diagnosis not present

## 2020-08-21 HISTORY — DX: Unspecified intestinal obstruction, unspecified as to partial versus complete obstruction: K56.609

## 2020-08-21 HISTORY — PX: LAPAROTOMY: SHX154

## 2020-08-21 HISTORY — PX: LYSIS OF ADHESION: SHX5961

## 2020-08-21 LAB — CREATININE, SERUM
Creatinine, Ser: 0.39 mg/dL — ABNORMAL LOW (ref 0.44–1.00)
GFR, Estimated: >60 mL/min (ref >60)

## 2020-08-21 LAB — CBC
HCT: 52 % — ABNORMAL HIGH (ref 36.0–46.0)
HCT: 43.2 % (ref 36.0–46.0)
Hemoglobin: 17.9 g/dL — ABNORMAL HIGH (ref 12.0–15.0)
Hemoglobin: 15.4 g/dL — ABNORMAL HIGH (ref 12.0–15.0)
MCH: 29.2 pg (ref 26.0–34.0)
MCH: 30 pg (ref 26.0–34.0)
MCHC: 34.4 g/dL (ref 30.0–36.0)
MCHC: 35.6 g/dL (ref 30.0–36.0)
MCV: 84.8 fL (ref 80.0–100.0)
MCV: 84 fL (ref 80.0–100.0)
Platelets: 244 10*3/uL (ref 150–400)
Platelets: 240 10*3/uL (ref 150–400)
RBC: 6.13 MIL/uL — ABNORMAL HIGH (ref 3.87–5.11)
RBC: 5.14 MIL/uL — ABNORMAL HIGH (ref 3.87–5.11)
RDW: 12.2 % (ref 11.5–15.5)
RDW: 12.2 % (ref 11.5–15.5)
WBC: 12.3 10*3/uL — ABNORMAL HIGH (ref 4.0–10.5)
WBC: 14.2 10*3/uL — ABNORMAL HIGH (ref 4.0–10.5)
nRBC: 0 % (ref 0.0–0.2)
nRBC: 0 % (ref 0.0–0.2)

## 2020-08-21 LAB — COMPREHENSIVE METABOLIC PANEL
ALT: 48 U/L — ABNORMAL HIGH (ref 0–44)
AST: 98 U/L — ABNORMAL HIGH (ref 15–41)
Albumin: 4.6 g/dL (ref 3.5–5.0)
Alkaline Phosphatase: 161 U/L — ABNORMAL HIGH (ref 38–126)
Anion gap: 15 (ref 5–15)
BUN: 8 mg/dL (ref 6–20)
CO2: 21 mmol/L — ABNORMAL LOW (ref 22–32)
Calcium: 10.1 mg/dL (ref 8.9–10.3)
Chloride: 101 mmol/L (ref 98–111)
Creatinine, Ser: 0.51 mg/dL (ref 0.44–1.00)
GFR, Estimated: >60 mL/min (ref >60)
Glucose, Bld: 165 mg/dL — ABNORMAL HIGH (ref 70–99)
Potassium: 3.9 mmol/L (ref 3.5–5.1)
Sodium: 137 mmol/L (ref 135–145)
Total Bilirubin: 1.9 mg/dL — ABNORMAL HIGH (ref 0.3–1.2)
Total Protein: 8.6 g/dL — ABNORMAL HIGH (ref 6.5–8.1)

## 2020-08-21 LAB — RESPIRATORY PANEL BY RT PCR (FLU A&B, COVID)
Influenza A by PCR: NEGATIVE
Influenza B by PCR: NEGATIVE
SARS Coronavirus 2 by RT PCR: NEGATIVE

## 2020-08-21 LAB — TROPONIN I (HIGH SENSITIVITY)
Troponin I (High Sensitivity): 3 ng/L (ref <18)
Troponin I (High Sensitivity): 5 ng/L (ref <18)

## 2020-08-21 LAB — LIPASE, BLOOD: Lipase: 1320 U/L — ABNORMAL HIGH (ref 11–51)

## 2020-08-21 SURGERY — LAPAROTOMY, EXPLORATORY
Anesthesia: General | Site: Abdomen

## 2020-08-21 MED ORDER — CEFAZOLIN SODIUM-DEXTROSE 2-4 GM/100ML-% IV SOLN
2.0000 g | Freq: Three times a day (TID) | INTRAVENOUS | Status: DC
  Administered 2020-08-21: 2 g via INTRAVENOUS

## 2020-08-21 MED ORDER — SUCCINYLCHOLINE CHLORIDE 20 MG/ML IJ SOLN
INTRAMUSCULAR | Status: DC | PRN
  Administered 2020-08-21: 100 mg via INTRAVENOUS

## 2020-08-21 MED ORDER — IBUPROFEN 400 MG PO TABS: 600.0000 mg | ORAL_TABLET | Freq: Four times a day (QID) | ORAL | Status: DC | PRN

## 2020-08-21 MED ORDER — ROCURONIUM BROMIDE 100 MG/10ML IV SOLN
INTRAVENOUS | Status: DC | PRN
  Administered 2020-08-21: 5 mg via INTRAVENOUS
  Administered 2020-08-21: 50 mg via INTRAVENOUS

## 2020-08-21 MED ORDER — LIDOCAINE HCL (CARDIAC) PF 100 MG/5ML IV SOSY
PREFILLED_SYRINGE | INTRAVENOUS | Status: DC | PRN
  Administered 2020-08-21: 100 mg via INTRAVENOUS

## 2020-08-21 MED ORDER — MORPHINE SULFATE (PF) 4 MG/ML IV SOLN
6.0000 mg | Freq: Once | INTRAVENOUS | Status: AC
  Administered 2020-08-21: 6 mg via INTRAVENOUS
  Filled 2020-08-21: qty 2

## 2020-08-21 MED ORDER — ENOXAPARIN SODIUM 40 MG/0.4ML ~~LOC~~ SOLN
40.0000 mg | SUBCUTANEOUS | Status: DC
  Administered 2020-08-22 – 2020-08-24 (×3): 40 mg via SUBCUTANEOUS
  Filled 2020-08-21 (×3): qty 0.4

## 2020-08-21 MED ORDER — PANTOPRAZOLE SODIUM 40 MG IV SOLR
40.0000 mg | Freq: Every day | INTRAVENOUS | Status: DC
  Administered 2020-08-21 – 2020-08-23 (×3): 40 mg via INTRAVENOUS
  Filled 2020-08-21 (×3): qty 40

## 2020-08-21 MED ORDER — TRAMADOL HCL 50 MG PO TABS: 50.0000 mg | ORAL_TABLET | Freq: Four times a day (QID) | ORAL | Status: DC | PRN

## 2020-08-21 MED ORDER — FENTANYL CITRATE (PF) 100 MCG/2ML IJ SOLN: 25.0000 ug | INTRAMUSCULAR | Status: DC | PRN

## 2020-08-21 MED ORDER — MIDAZOLAM HCL 2 MG/2ML IJ SOLN
INTRAMUSCULAR | Status: AC
  Filled 2020-08-21: qty 2

## 2020-08-21 MED ORDER — MORPHINE SULFATE (PF) 4 MG/ML IV SOLN
4.0000 mg | INTRAVENOUS | Status: DC | PRN
  Administered 2020-08-21: 4 mg via INTRAVENOUS
  Filled 2020-08-21: qty 1

## 2020-08-21 MED ORDER — CEFAZOLIN SODIUM 1 G IJ SOLR
INTRAMUSCULAR | Status: AC
  Filled 2020-08-21: qty 10

## 2020-08-21 MED ORDER — BUPIVACAINE HCL 0.25 % IJ SOLN
INTRAMUSCULAR | Status: DC | PRN
  Administered 2020-08-21: 30 mL

## 2020-08-21 MED ORDER — MORPHINE SULFATE (PF) 2 MG/ML IV SOLN
1.0000 mg | INTRAVENOUS | Status: DC | PRN
  Administered 2020-08-22 (×4): 1 mg via INTRAVENOUS
  Filled 2020-08-21 (×4): qty 1

## 2020-08-21 MED ORDER — FENTANYL CITRATE (PF) 100 MCG/2ML IJ SOLN
INTRAMUSCULAR | Status: DC | PRN
  Administered 2020-08-21 (×2): 25 ug via INTRAVENOUS

## 2020-08-21 MED ORDER — METOPROLOL TARTRATE 5 MG/5ML IV SOLN
INTRAVENOUS | Status: DC | PRN
  Administered 2020-08-21: 1 mg via INTRAVENOUS

## 2020-08-21 MED ORDER — DROPERIDOL 2.5 MG/ML IJ SOLN
2.5000 mg | Freq: Once | INTRAMUSCULAR | Status: AC
  Administered 2020-08-21: 2.5 mg via INTRAVENOUS
  Filled 2020-08-21: qty 2

## 2020-08-21 MED ORDER — PHENOL 1.4 % MT LIQD
1.0000 | OROMUCOSAL | Status: DC | PRN
  Filled 2020-08-21 (×2): qty 177

## 2020-08-21 MED ORDER — GLYCOPYRROLATE 0.2 MG/ML IJ SOLN
INTRAMUSCULAR | Status: DC | PRN
  Administered 2020-08-21: .1 mg via INTRAVENOUS

## 2020-08-21 MED ORDER — OXYCODONE HCL 5 MG/5ML PO SOLN: 5.0000 mg | Freq: Once | ORAL | Status: DC | PRN

## 2020-08-21 MED ORDER — ACETAMINOPHEN 325 MG PO TABS: 650.0000 mg | ORAL_TABLET | Freq: Four times a day (QID) | ORAL | Status: DC | PRN

## 2020-08-21 MED ORDER — ONDANSETRON HCL 4 MG/2ML IJ SOLN: 4.0000 mg | Freq: Four times a day (QID) | INTRAMUSCULAR | Status: DC | PRN

## 2020-08-21 MED ORDER — FENTANYL CITRATE (PF) 250 MCG/5ML IJ SOLN
INTRAMUSCULAR | Status: AC
  Filled 2020-08-21: qty 5

## 2020-08-21 MED ORDER — OXYCODONE HCL 5 MG PO TABS: 5.0000 mg | ORAL_TABLET | Freq: Once | ORAL | Status: DC | PRN

## 2020-08-21 MED ORDER — ONDANSETRON HCL 4 MG/2ML IJ SOLN
4.0000 mg | Freq: Once | INTRAMUSCULAR | Status: AC
  Administered 2020-08-21: 4 mg via INTRAVENOUS
  Filled 2020-08-21: qty 2

## 2020-08-21 MED ORDER — ONDANSETRON HCL 4 MG/2ML IJ SOLN
INTRAMUSCULAR | Status: DC | PRN
  Administered 2020-08-21: 4 mg via INTRAVENOUS

## 2020-08-21 MED ORDER — BUPIVACAINE LIPOSOME 1.3 % IJ SUSP
INTRAMUSCULAR | Status: AC
  Filled 2020-08-21: qty 20

## 2020-08-21 MED ORDER — SUGAMMADEX SODIUM 500 MG/5ML IV SOLN
INTRAVENOUS | Status: DC | PRN
  Administered 2020-08-21: 300 mg via INTRAVENOUS

## 2020-08-21 MED ORDER — ROPIVACAINE HCL 5 MG/ML IJ SOLN
INTRAMUSCULAR | Status: AC
  Filled 2020-08-21: qty 20

## 2020-08-21 MED ORDER — SODIUM CHLORIDE (PF) 0.9 % IJ SOLN
INTRAMUSCULAR | Status: AC
  Filled 2020-08-21: qty 50

## 2020-08-21 MED ORDER — PROPOFOL 10 MG/ML IV BOLUS
INTRAVENOUS | Status: AC
  Filled 2020-08-21: qty 20

## 2020-08-21 MED ORDER — HYDROMORPHONE HCL 1 MG/ML IJ SOLN
INTRAMUSCULAR | Status: DC | PRN
Start: 2020-08-21 — End: 2020-08-21
  Administered 2020-08-21: .5 mg via INTRAVENOUS

## 2020-08-21 MED ORDER — IOHEXOL 9 MG/ML PO SOLN
500.0000 mL | Freq: Two times a day (BID) | ORAL | Status: DC | PRN
  Administered 2020-08-21 (×2): 500 mL via ORAL

## 2020-08-21 MED ORDER — MIDAZOLAM HCL 2 MG/2ML IJ SOLN
INTRAMUSCULAR | Status: DC | PRN
  Administered 2020-08-21: 2 mg via INTRAVENOUS

## 2020-08-21 MED ORDER — ONDANSETRON 4 MG PO TBDP: 4.0000 mg | ORAL_TABLET | Freq: Four times a day (QID) | ORAL | Status: DC | PRN

## 2020-08-21 MED ORDER — IOHEXOL 300 MG/ML  SOLN
100.0000 mL | Freq: Once | INTRAMUSCULAR | Status: AC | PRN
  Administered 2020-08-21: 100 mL via INTRAVENOUS

## 2020-08-21 MED ORDER — HYDROCODONE-ACETAMINOPHEN 5-325 MG PO TABS
1.0000 | ORAL_TABLET | Freq: Four times a day (QID) | ORAL | Status: DC | PRN
  Administered 2020-08-21 – 2020-08-23 (×3): 1 via ORAL
  Filled 2020-08-21 (×3): qty 1

## 2020-08-21 MED ORDER — DEXAMETHASONE SODIUM PHOSPHATE 10 MG/ML IJ SOLN
INTRAMUSCULAR | Status: DC | PRN
  Administered 2020-08-21: 10 mg via INTRAVENOUS

## 2020-08-21 MED ORDER — LACTATED RINGERS IV SOLN: INTRAVENOUS | Status: DC | PRN

## 2020-08-21 MED ORDER — PROPOFOL 10 MG/ML IV BOLUS
INTRAVENOUS | Status: DC | PRN
  Administered 2020-08-21: 120 mg via INTRAVENOUS

## 2020-08-21 MED ORDER — HYDROMORPHONE HCL 1 MG/ML IJ SOLN
INTRAMUSCULAR | Status: AC
  Filled 2020-08-21: qty 1

## 2020-08-21 MED ORDER — LACTATED RINGERS IV BOLUS
1000.0000 mL | Freq: Once | INTRAVENOUS | Status: AC
  Administered 2020-08-21: 1000 mL via INTRAVENOUS

## 2020-08-21 MED ORDER — ESMOLOL HCL 100 MG/10ML IV SOLN
INTRAVENOUS | Status: DC | PRN
  Administered 2020-08-21: 10 mg via INTRAVENOUS

## 2020-08-21 MED ORDER — BUPIVACAINE LIPOSOME 1.3 % IJ SUSP
INTRAMUSCULAR | Status: DC | PRN
  Administered 2020-08-21: 20 mL

## 2020-08-21 MED ORDER — SODIUM CHLORIDE (PF) 0.9 % IJ SOLN
INTRAMUSCULAR | Status: DC | PRN
  Administered 2020-08-21: 30 mL

## 2020-08-21 MED ORDER — BUPIVACAINE HCL (PF) 0.25 % IJ SOLN
INTRAMUSCULAR | Status: AC
  Filled 2020-08-21: qty 30

## 2020-08-21 MED ORDER — LACTATED RINGERS IV SOLN: INTRAVENOUS | Status: DC

## 2020-08-21 SURGICAL SUPPLY — 37 items
APPLIER CLIP 13 LRG OPEN (CLIP) ×2
BLADE SURG SZ10 CARB STEEL (BLADE) ×2 IMPLANT
CHLORAPREP W/TINT 26 (MISCELLANEOUS) ×2 IMPLANT
CLIP APPLIE 13 LRG OPEN (CLIP) ×1 IMPLANT
COVER WAND RF STERILE (DRAPES) ×2 IMPLANT
DRSG OPSITE POSTOP 4X10 (GAUZE/BANDAGES/DRESSINGS) ×2 IMPLANT
ELECT BLADE 6 FLAT ULTRCLN (ELECTRODE) IMPLANT
ELECT REM PT RETURN 9FT ADLT (ELECTROSURGICAL) ×4
ELECTRODE REM PT RTRN 9FT ADLT (ELECTROSURGICAL) ×2 IMPLANT
GAUZE SPONGE 4X4 12PLY STRL (GAUZE/BANDAGES/DRESSINGS) ×2 IMPLANT
GLOVE BIOGEL PI IND STRL 7.0 (GLOVE) ×1 IMPLANT
GLOVE BIOGEL PI INDICATOR 7.0 (GLOVE) ×1
GLOVE SURG SYN 6.5 ES PF (GLOVE) ×2 IMPLANT
GOWN STRL REUS W/TWL LRG LVL3 (GOWN DISPOSABLE) ×6 IMPLANT
HANDLE SUCTION POOLE (INSTRUMENTS) ×1 IMPLANT
KIT TURNOVER KIT A (KITS) ×2 IMPLANT
LIGASURE IMPACT 36 18CM CVD LR (INSTRUMENTS) ×2 IMPLANT
MANIFOLD NEPTUNE II (INSTRUMENTS) ×2 IMPLANT
NS IRRIG 1000ML POUR BTL (IV SOLUTION) ×2 IMPLANT
PACK BASIN MAJOR ARMC (MISCELLANEOUS) ×2 IMPLANT
RELOAD LINEAR CUT PROX 55 BLUE (ENDOMECHANICALS) IMPLANT
RELOAD STAPLER LINEAR PROX 30 (STAPLE) IMPLANT
SPONGE LAP 18X18 RF (DISPOSABLE) ×2 IMPLANT
STAPLER PROXIMATE 55 BLUE (STAPLE) IMPLANT
STAPLER RELOAD LINEAR PROX 30 (STAPLE)
SUCTION POOLE HANDLE (INSTRUMENTS) ×2
SUT CHROMIC 0 SH (SUTURE) IMPLANT
SUT CHROMIC 2 0 SH (SUTURE) IMPLANT
SUT CHROMIC 3 0 SH 27 (SUTURE) IMPLANT
SUT PROLENE 3 0 PS 2 (SUTURE) IMPLANT
SUT SILK 0 FSL (SUTURE) IMPLANT
SUT SILK 2 0 (SUTURE)
SUT SILK 2 0 REEL (SUTURE) IMPLANT
SUT SILK 2-0 18XBRD TIE 12 (SUTURE) IMPLANT
SUT VIC AB 0 CT1 18XCR BRD 8 (SUTURE) IMPLANT
SUT VIC AB 0 CT1 8-18 (SUTURE)
TOWEL OR 17X26 4PK STRL BLUE (TOWEL DISPOSABLE) ×2 IMPLANT

## 2020-08-21 NOTE — ED Notes (Signed)
Pt states is going to sign decline form for POC urine preg test. Called CT who said will have pt sign waiver.

## 2020-08-21 NOTE — Transfer of Care (Signed)
Immediate Anesthesia Transfer of Care Note  [name]  Procedure(s) Performed: EXPLORATORY LAPAROTOMY (N/A Abdomen) LYSIS OF ADHESION (N/A Abdomen)  Patient Location: PACU  Anesthesia Type:General  Level of Consciousness: awake, drowsy and patient cooperative  Airway & Oxygen Therapy: Patient Spontanous Breathing  Post-op Assessment: Report given to RN and Post -op Vital signs reviewed and stable  Post vital signs: Reviewed and stable  Last Vitals:  Vitals Value Taken Time  BP 146/83 08/21/20 1454  Temp 36.7 C 08/21/20 1438  Pulse 70 08/21/20 1455  Resp 14 08/21/20 1455  SpO2 100 % 08/21/20 1455  Vitals shown include unvalidated device data.  Last Pain:  Vitals:   08/21/20 1454  TempSrc:   PainSc: 0-No pain         Complications: No complications documented.
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
## 2020-08-21 NOTE — ED Notes (Signed)
Pt taken to CT.

## 2020-08-21 NOTE — Op Note (Addendum)
Preoperative diagnosis: Small bowel obstruction secondary to closed-loop obstruction  Postoperative diagnosis: Same  Procedure: Exploratory laparotomy, lysis of adhesions Anesthesia: GETA  Surgeon: Lysle Pearl  Wound Classification: Clean  Specimen: None Complications: None  EBL: 15 mL  Indications:  Patient is a 44 y.o. female presented for above.  History of duodenal switch, subsequent hiatal hernia repair and closure of mesenteric defect.  CT exam concerning for possible internal hernia causing closed-loop obstruction around the area of previous surgery.  Description of procedure: The patient was taken to the operating room and placed in the supine position. General endotracheal anesthesia was induced without any difficulty. A time-out was completed verifying correct patient, procedure, site, positioning, and implant(s) and/or special equipment prior to beginning this procedure.A Foley catheter and an nasogastric tube were placed. Preoperative antibiotics were given.   Area sterilized and draped in the usual fashion. Incision made in the supraumbilical area and dissection carried down to fascia where the abdominal cavity was entered.  Inspection of the abdominal cavity noted dilated loops of bowel leading to an area in the left upper quadrant, where there was a palpable dense band stemming from suture used for the previous repair of the Petersons defect.  This thick band was transected using a ligature and immediate release of the volvulized bowel around the band was noted.  Inspection of the bowel running from the newly created common limb anastomosis all the way to the pancreaticobiliary area noted no additional pathology.  The Roux limb was also inspected and noted no additional pathology.  Transverse colon noted to be in the background not attached anything significant.  1 small bleeding was noted near the area of the band attachment to the small bowel, and this was controlled with 3-0 silk x1.   Excess fluid buildup was then suctioned out prior to closing the midline incision with 0 PDS x2.  Skin was then approximated using interrupted 3-0 Vicryl in the subdermal layer and closing the skin with staplers.  Wound was then dressed with honeycomb dressing.  The patient tolerated the procedure well and was extubated and taken to the postoperative care unit in stable condition accom- panied by the surgical and anesthesia teams.  NG remains in place.  Foley catheter removed prior to extubation.  Sponge and instrument count correct at end of procedure.
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
## 2020-08-21 NOTE — Anesthesia Procedure Notes (Addendum)
Procedure Name: Intubation Performed by: Kelton Pillar, CRNA Pre-anesthesia Checklist: Patient identified, Emergency Drugs available, Suction available and Patient being monitored Patient Re-evaluated:Patient Re-evaluated prior to induction Oxygen Delivery Method: Circle system utilized Preoxygenation: Pre-oxygenation with 100% oxygen Induction Type: IV induction, Rapid sequence and Cricoid Pressure applied Laryngoscope Size: McGraph and 3 Tube type: Oral Tube size: 6.5 mm Number of attempts: 1 Airway Equipment and Method: Stylet and Oral airway Placement Confirmation: ETT inserted through vocal cords under direct vision,  positive ETCO2,  breath sounds checked- equal and bilateral and CO2 detector Secured at: 21 cm Tube secured with: Tape Dental Injury: Teeth and Oropharynx as per pre-operative assessment

## 2020-08-21 NOTE — ED Notes (Signed)
Pt to OR.

## 2020-08-21 NOTE — Anesthesia Postprocedure Evaluation (Signed)
Anesthesia Post Note  Patient: CALA KRUCKENBERG  Procedure(s) Performed: EXPLORATORY LAPAROTOMY (N/A Abdomen) LYSIS OF ADHESION (N/A Abdomen)  Patient location during evaluation: PACU Anesthesia Type: General Level of consciousness: awake and alert Pain management: pain level controlled Vital Signs Assessment: post-procedure vital signs reviewed and stable Respiratory status: spontaneous breathing, nonlabored ventilation, respiratory function stable and patient connected to nasal cannula oxygen Cardiovascular status: blood pressure returned to baseline and stable Postop Assessment: no apparent nausea or vomiting Anesthetic complications: no   No complications documented.   Last Vitals:  Vitals:   08/21/20 1645 08/21/20 1721  BP:  (!) 141/107  Pulse: 72 89  Resp: 20 20  Temp: 37.6 C 36.8 C  SpO2: 96% 99%    Last Pain:  Vitals:   08/21/20 1721  TempSrc: Oral  PainSc:                  Precious Haws Arlo Butt

## 2020-08-21 NOTE — ED Triage Notes (Signed)
Pt arrived via POV with c/o abd pain LUQ since 12am, pt also states she is having L shoulder pain, released from [facility] on 11/13 after being admitted since Thursday for possible bowel obstruction.  Pt reports V and D.   Pt reports she took hydrocodone with no relief.

## 2020-08-22 ENCOUNTER — Encounter: Payer: Self-pay | Admitting: Surgery

## 2020-08-22 LAB — URINALYSIS, COMPLETE (UACMP) WITH MICROSCOPIC
Bilirubin Urine: NEGATIVE
Glucose, UA: NEGATIVE mg/dL
Hgb urine dipstick: NEGATIVE
Ketones, ur: 20 mg/dL — AB
Leukocytes,Ua: NEGATIVE
Nitrite: NEGATIVE
Protein, ur: NEGATIVE mg/dL
Specific Gravity, Urine: 1.014 (ref 1.005–1.030)
pH: 6 (ref 5.0–8.0)

## 2020-08-22 LAB — BASIC METABOLIC PANEL
Anion gap: 10 (ref 5–15)
BUN: 7 mg/dL (ref 6–20)
CO2: 23 mmol/L (ref 22–32)
Calcium: 9 mg/dL (ref 8.9–10.3)
Chloride: 104 mmol/L (ref 98–111)
Creatinine, Ser: 0.57 mg/dL (ref 0.44–1.00)
GFR, Estimated: >60 mL/min (ref >60)
Glucose, Bld: 98 mg/dL (ref 70–99)
Potassium: 3.6 mmol/L (ref 3.5–5.1)
Sodium: 137 mmol/L (ref 135–145)

## 2020-08-22 LAB — CBC
HCT: 41.2 % (ref 36.0–46.0)
Hemoglobin: 14.1 g/dL (ref 12.0–15.0)
MCH: 29.7 pg (ref 26.0–34.0)
MCHC: 34.2 g/dL (ref 30.0–36.0)
MCV: 86.9 fL (ref 80.0–100.0)
Platelets: 235 10*3/uL (ref 150–400)
RBC: 4.74 MIL/uL (ref 3.87–5.11)
RDW: 12.4 % (ref 11.5–15.5)
WBC: 11.8 10*3/uL — ABNORMAL HIGH (ref 4.0–10.5)
nRBC: 0 % (ref 0.0–0.2)

## 2020-08-22 NOTE — Progress Notes (Signed)
Order received from Dr Lysle Pearl to hook suction to NG tube. Order said to remove NG and start clear liquids if drainage was less than 273m. Patient had 059moutput. NG removed. Clear liquid diet started

## 2020-08-22 NOTE — Progress Notes (Signed)
Subjective:  CC: Mariah Martin is a 44 y.o. female  Hospital stay day 1, 1 Day Post-Op exploratory laparotomy lysis of adhesion  HPI: No issues reported overnight.  ROS:  General: Denies weight loss, weight gain, fatigue, fevers, chills, and night sweats. Heart: Denies chest pain, palpitations, racing heart, irregular heartbeat, leg pain or swelling, and decreased activity tolerance. Respiratory: Denies breathing difficulty, shortness of breath, wheezing, cough, and sputum. GI: Denies change in appetite, heartburn, nausea, vomiting, constipation, diarrhea, and blood in stool. GU: Denies difficulty urinating, pain with urinating, urgency, frequency, blood in urine.   Objective:   Temp:  [98.2 F (36.8 C)-99.6 F (37.6 C)] 98.7 F (37.1 C) (11/15 1214) Pulse Rate:  [63-89] 64 (11/15 1214) Resp:  [12-22] 20 (11/15 1214) BP: (141-160)/(79-107) 157/88 (11/15 1214) SpO2:  [96 %-100 %] 100 % (11/15 1214)     Height: 5' 2"  (157.5 cm) Weight: 77.1 kg BMI (Calculated): 31.09   Intake/Output this shift:   Intake/Output Summary (Last 24 hours) at 08/22/2020 1456 Last data filed at 08/22/2020 1400 Gross per 24 hour  Intake 2714.01 ml  Output 100 ml  Net 2614.01 ml    Constitutional :  alert, cooperative, appears stated age and no distress  Respiratory:  clear to auscultation bilaterally  Cardiovascular:  regular rate and rhythm  Gastrointestinal: Soft, no guarding, appropriate tenderness incision around incision site.  Specific epigastric tenderness anymore.   Skin: Cool and moist.   Psychiatric: Normal affect, non-agitated, not confused       LABS:  CMP Latest Ref Rng & Units 08/22/2020 08/21/2020 08/21/2020  Glucose 70 - 99 mg/dL 98 - 165(H)  BUN 6 - 20 mg/dL 7 - 8  Creatinine 0.44 - 1.00 mg/dL 0.57 0.39(L) 0.51  Sodium 135 - 145 mmol/L 137 - 137  Potassium 3.5 - 5.1 mmol/L 3.6 - 3.9  Chloride 98 - 111 mmol/L 104 - 101  CO2 22 - 32 mmol/L 23 - 21(L)  Calcium 8.9 - 10.3  mg/dL 9.0 - 10.1  Total Protein 6.5 - 8.1 g/dL - - 8.6(H)  Total Bilirubin 0.3 - 1.2 mg/dL - - 1.9(H)  Alkaline Phos 38 - 126 U/L - - 161(H)  AST 15 - 41 U/L - - 98(H)  ALT 0 - 44 U/L - - 48(H)   CBC Latest Ref Rng & Units 08/22/2020 08/21/2020 08/21/2020  WBC 4.0 - 10.5 K/uL 11.8(H) 14.2(H) 12.3(H)  Hemoglobin 12.0 - 15.0 g/dL 14.1 15.4(H) 17.9(H)  Hematocrit 36 - 46 % 41.2 43.2 52.0(H)  Platelets 150 - 400 K/uL 235 240 244    RADS: n/a Assessment:   S/p  exploratory laparotomy lysis of adhesion for closed-loop obstruction.  Doing well.  Passed clamping trial this a.m. so NG tube was pulled and started on clears.  We will continue to monitor for now  Pancreatitis likely caused by the hepatobiliary limb obstruction secondary to closed-loop as noted above.  Seems to be clinically improving.  Continue to monitor diet advancement.  Continue fluids for 1 more day

## 2020-08-22 NOTE — Progress Notes (Signed)
Order received from Dr Lysle Pearl to discontinue telemetry

## 2020-08-23 LAB — BASIC METABOLIC PANEL
Anion gap: 8 (ref 5–15)
BUN: 5 mg/dL — ABNORMAL LOW (ref 6–20)
CO2: 27 mmol/L (ref 22–32)
Calcium: 8.6 mg/dL — ABNORMAL LOW (ref 8.9–10.3)
Chloride: 103 mmol/L (ref 98–111)
Creatinine, Ser: 0.51 mg/dL (ref 0.44–1.00)
GFR, Estimated: >60 mL/min (ref >60)
Glucose, Bld: 95 mg/dL (ref 70–99)
Potassium: 3.6 mmol/L (ref 3.5–5.1)
Sodium: 138 mmol/L (ref 135–145)

## 2020-08-23 LAB — CBC
HCT: 32 % — ABNORMAL LOW (ref 36.0–46.0)
Hemoglobin: 10.9 g/dL — ABNORMAL LOW (ref 12.0–15.0)
MCH: 29.9 pg (ref 26.0–34.0)
MCHC: 34.1 g/dL (ref 30.0–36.0)
MCV: 87.7 fL (ref 80.0–100.0)
Platelets: 176 10*3/uL (ref 150–400)
RBC: 3.65 MIL/uL — ABNORMAL LOW (ref 3.87–5.11)
RDW: 12.5 % (ref 11.5–15.5)
WBC: 6.3 10*3/uL (ref 4.0–10.5)
nRBC: 0 % (ref 0.0–0.2)

## 2020-08-23 MED ORDER — DOCUSATE SODIUM 100 MG PO CAPS: 100.0000 mg | ORAL_CAPSULE | Freq: Every day | ORAL | Status: DC | PRN

## 2020-08-23 MED ORDER — ALUM & MAG HYDROXIDE-SIMETH 200-200-20 MG/5ML PO SUSP
30.0000 mL | Freq: Four times a day (QID) | ORAL | Status: DC | PRN
  Administered 2020-08-23: 30 mL via ORAL
  Filled 2020-08-23: qty 30

## 2020-08-23 NOTE — Progress Notes (Signed)
Subjective:  CC: Mariah Martin is a 44 y.o. female  Hospital stay day 2, 2 Days Post-Op exploratory laparotomy lysis of adhesion  HPI: No issues reported overnight.  Tolerating clears  ROS:  General: Denies weight loss, weight gain, fatigue, fevers, chills, and night sweats. Heart: Denies chest pain, palpitations, racing heart, irregular heartbeat, leg pain or swelling, and decreased activity tolerance. Respiratory: Denies breathing difficulty, shortness of breath, wheezing, cough, and sputum. GI: Denies change in appetite, heartburn, nausea, vomiting, constipation, diarrhea, and blood in stool. GU: Denies difficulty urinating, pain with urinating, urgency, frequency, blood in urine.   Objective:   Temp:  [98.1 F (36.7 C)-98.7 F (37.1 C)] 98.3 F (36.8 C) (11/16 1154) Pulse Rate:  [61-66] 66 (11/16 1154) Resp:  [16-20] 16 (11/16 1154) BP: (133-157)/(74-89) 141/74 (11/16 1154) SpO2:  [97 %-100 %] 100 % (11/16 1154)     Height: 5' 2"  (157.5 cm) Weight: 77.1 kg BMI (Calculated): 31.09   Intake/Output this shift:   Intake/Output Summary (Last 24 hours) at 08/23/2020 1234 Last data filed at 08/23/2020 0900 Gross per 24 hour  Intake 2353.58 ml  Output --  Net 2353.58 ml    Constitutional :  alert, cooperative, appears stated age and no distress  Respiratory:  clear to auscultation bilaterally  Cardiovascular:  regular rate and rhythm  Gastrointestinal: Soft, no guarding, appropriate tenderness incision around incision site.  Mild epigastric tenderness .   Skin: Cool and moist.   Psychiatric: Normal affect, non-agitated, not confused       LABS:  CMP Latest Ref Rng & Units 08/23/2020 08/22/2020 08/21/2020  Glucose 70 - 99 mg/dL 95 98 -  BUN 6 - 20 mg/dL 5(L) 7 -  Creatinine 0.44 - 1.00 mg/dL 0.51 0.57 0.39(L)  Sodium 135 - 145 mmol/L 138 137 -  Potassium 3.5 - 5.1 mmol/L 3.6 3.6 -  Chloride 98 - 111 mmol/L 103 104 -  CO2 22 - 32 mmol/L 27 23 -  Calcium 8.9 - 10.3  mg/dL 8.6(L) 9.0 -  Total Protein 6.5 - 8.1 g/dL - - -  Total Bilirubin 0.3 - 1.2 mg/dL - - -  Alkaline Phos 38 - 126 U/L - - -  AST 15 - 41 U/L - - -  ALT 0 - 44 U/L - - -   CBC Latest Ref Rng & Units 08/23/2020 08/22/2020 08/21/2020  WBC 4.0 - 10.5 K/uL 6.3 11.8(H) 14.2(H)  Hemoglobin 12.0 - 15.0 g/dL 10.9(L) 14.1 15.4(H)  Hematocrit 36 - 46 % 32.0(L) 41.2 43.2  Platelets 150 - 400 K/uL 176 235 240    RADS: n/a Assessment:   S/p  exploratory laparotomy lysis of adhesion for closed-loop obstruction.  Doing well.  Tolerating clears but has some early satiety.  Continue with clears for today until further improvement of symptoms also continue IV fluids to ensure pancreatitis has completely resolved.  She is now passing flatus, no recorded BM.

## 2020-08-24 MED ORDER — IBUPROFEN 800 MG PO TABS: 800.0000 mg | ORAL_TABLET | Freq: Three times a day (TID) | ORAL | 0 refills | Status: DC | PRN

## 2020-08-24 MED ORDER — DOCUSATE SODIUM 100 MG PO CAPS: 100.0000 mg | ORAL_CAPSULE | Freq: Two times a day (BID) | ORAL | 0 refills | Status: AC | PRN

## 2020-08-24 MED ORDER — ACETAMINOPHEN 325 MG PO TABS: 650.0000 mg | ORAL_TABLET | Freq: Three times a day (TID) | ORAL | 0 refills | Status: DC | PRN

## 2020-08-24 MED ORDER — HYDROCODONE-ACETAMINOPHEN 5-325 MG PO TABS: 1.0000 | ORAL_TABLET | Freq: Four times a day (QID) | ORAL | 0 refills | Status: DC | PRN

## 2020-08-24 NOTE — Discharge Instructions (Signed)
Lysis of adhesions, Care After This sheet gives you information about how to care for yourself after your procedure. Your health care provider may also give you more specific instructions. If you have problems or questions, contact your health care provider. What can I expect after the procedure? After the procedure, it is common to have:  Soreness.  Bruising.  Itching. Follow these instructions at home: site care Follow instructions from your health care provider about how to take care of your site. Make sure you:  Wash your hands with soap and water before and after you change your bandage (dressing). If soap and water are not available, use hand sanitizer.  Leave stitches (sutures), skin glue, or adhesive strips in place. These skin closures may need to stay in place for 2 weeks or longer. If adhesive strip edges start to loosen and curl up, you may trim the loose edges. Do not remove adhesive strips completely unless your health care provider tells you to do that.  If the area bleeds or bruises, apply gentle pressure for 10 minutes.  OK TO SHOWER IN 24HRS  Check your site every day for signs of infection. Check for:  Redness, swelling, or pain.  Fluid or blood.  Warmth.  Pus or a bad smell.  General instructions  Rest and then return to your normal activities as told by your health care provider. .  tylenol and advil as needed for discomfort.  Please alternate between the two every four hours as needed for pain.   .  Use narcotics, if prescribed, only when tylenol and motrin is not enough to control pain. .  325-678m every 8hrs to max of 30057m24hrs (including the 32572mn every norco dose) for the tylenol.   .  Advil up to 800m68mr dose every 8hrs as needed for pain.    Keep all follow-up visits as told by your health care provider. This is important. Contact a health care provider if:  You have redness, swelling, or pain around your site.  You have fluid or blood  coming from your site.  Your site feels warm to the touch.  You have pus or a bad smell coming from your site.  You have a fever.  Your sutures, skin glue, or adhesive strips loosen or come off sooner than expected. Get help right away if:  You have bleeding that does not stop with pressure or a dressing. Summary  After the procedure, it is common to have some soreness, bruising, and itching at the site.  Follow instructions from your health care provider about how to take care of your site.  Check your site every day for signs of infection.  Contact a health care provider if you have redness, swelling, or pain around your site, or your site feels warm to the touch.  Keep all follow-up visits as told by your health care provider. This is important. This information is not intended to replace advice given to you by your health care provider. Make sure you discuss any questions you have with your health care provider. Document Released: 10/21/2015 Document Revised: 03/24/2018 Document Reviewed: 03/24/2018 Elsevier Interactive Patient Education  2019[employer]

## 2020-08-24 NOTE — Progress Notes (Signed)
MD ordered patient to be discharged home.  Discharge instructions were reviewed with the patient and she voiced understanding.  Follow-up appointment was made.  Prescriptions sent to the patients pharmacy.  IV was removed with catheter intact.  All patients questions were answered.  Patient leaving via wheelchair escorted by auxillary.

## 2020-08-25 NOTE — Discharge Summary (Signed)
Physician Discharge Summary  Patient ID: Mariah Martin MRN: [id number] DOB/AGE: 1976/07/13 44 y.o.  Admit date: 08/21/2020 Discharge date: 08/24/2020  Admission Diagnoses: Closed-loop obstruction and pancreatitis  Discharge Diagnoses:  Same as above  Discharged Condition: good  Hospital Course: Admitted for above, underwent urgent exploratory laparotomy and lysis of adhesions.  Please see op note for details.  Postop, recovered as expected with gradual toleration of diet, appropriate resolution of pain, and return of bowel function.  Patient was discharged with follow-up as an outpatient.  Consults: None  Discharge Exam: Blood pressure (!) 141/80, pulse 61, temperature 98.9 F (37.2 C), resp. rate 16, height 5' 2"  (1.575 m), weight 77.1 kg, SpO2 100 %. General appearance: alert, cooperative and no distress GI: Soft, no guarding, appropriate tenderness to incision site with staples clean dry and intact  Disposition:  Discharge disposition: 01-Home or Self Care       Discharge Instructions    Discharge patient   Complete by: As directed    Discharge disposition: 01-Home or Self Care   Discharge patient date: 08/24/2020     Allergies as of 08/24/2020      Reactions   Tramadol Other (See Comments)   tremor      Medication List    TAKE these medications   acetaminophen 325 MG tablet Commonly known as: Tylenol Take 2 tablets (650 mg total) by mouth every 8 (eight) hours as needed for mild pain.   calcium citrate-vitamin D 500-400 MG-UNIT chewable tablet Chew 1 tablet by mouth as directed.   Dexilant 60 MG capsule Generic drug: dexlansoprazole Take 1 capsule (60 mg total) by mouth daily. Please schedule an office visit before anymore refills.   docusate sodium 100 MG capsule Commonly known as: Colace Take 1 capsule (100 mg total) by mouth 2 (two) times daily as needed for up to 10 days for mild constipation.   HYDROcodone-acetaminophen 5-325 MG  tablet Commonly known as: Norco Take 1 tablet by mouth every 6 (six) hours as needed for up to 6 doses for moderate pain.   ibuprofen 800 MG tablet Commonly known as: ADVIL Take 1 tablet (800 mg total) by mouth every 8 (eight) hours as needed for mild pain or moderate pain.   Multi-Vitamin tablet Take 1 tablet by mouth daily.   prochlorperazine 5 MG tablet Commonly known as: COMPAZINE Take 5 mg by mouth 4 (four) times daily as needed for nausea/vomiting.   Xifaxan 550 MG Tabs tablet Generic drug: rifaximin Take 550 mg by mouth 3 (three) times daily.       Follow-up Information    [place], Ryver Poblete, DO. Go on 08/29/2020.   Specialty: Surgery Why: 9am appointment Contact information: [place] [address] [phone]                Total time spent arranging discharge was >37mn. Signed: IBenjamine Sprague11/18/2021, 1:16 PM

## 2020-08-26 ENCOUNTER — Encounter: Payer: Self-pay | Admitting: Obstetrics and Gynecology

## 2020-08-26 LAB — COLOGUARD

## 2020-08-29 LAB — COLOGUARD

## 2020-08-29 LAB — EXTERNAL GENERIC LAB PROCEDURE

## 2020-09-04 DIAGNOSIS — Z1211 Encounter for screening for malignant neoplasm of colon: Secondary | ICD-10-CM | POA: Diagnosis not present

## 2020-09-04 LAB — COLOGUARD: Cologuard: NEGATIVE

## 2020-09-05 ENCOUNTER — Other Ambulatory Visit: Payer: Self-pay | Admitting: Physician Assistant

## 2020-09-05 ENCOUNTER — Telehealth: Payer: Self-pay

## 2020-09-05 DIAGNOSIS — K219 Gastro-esophageal reflux disease without esophagitis: Secondary | ICD-10-CM

## 2020-09-05 NOTE — Progress Notes (Signed)
MyChart Video Visit    Virtual Visit via Video Note   This visit type was conducted due to national recommendations for restrictions regarding the COVID-19 Pandemic (e.g. social distancing) in an effort to limit this patient's exposure and mitigate transmission in our community. This patient is at least at moderate risk for complications without adequate follow up. This format is felt to be most appropriate for this patient at this time. Physical exam was limited by quality of the video and audio technology used for the visit.   Patient location: work Provider location: [facility]  I discussed the limitations of evaluation and management by telemedicine and the availability of in person appointments. The patient expressed understanding and agreed to proceed.  [name]   DOB: 13-Sep-1976   44 y.o. Female  MRN: [id number] Visit Date: 09/07/2020  Today's healthcare provider: Mar Daring, PA-C   No chief complaint on file.  Subjective    HPI  Anneli Bing is a 44 yr old female that presents today for hospital follow up. She was admitted on 08/21/20 for abdominal pain. She was found to have a small bowel obstruction. She had to undergo laparoscopic laparotomy. She reports she has been doing very well and has not had any issues. Incisions have been healing well.   Patient Active Problem List   Diagnosis Date Noted  . SBO (small bowel obstruction) ([place]) 08/21/2020  . Ventral hernia without obstruction or gangrene 12/22/2019  . 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  . BMI 29.0-29.9,adult 02/18/2019  . Avascular necrosis ([place]) 07/29/2018  . 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 Left upper quadrant abdominal swelling, mass and lump 01/30/2016  . Absolute anemia 01/30/2016  . Anxiety 01/30/2016  . Breast pain 01/30/2016  . Abnormal liver enzymes 01/30/2016  . Fibroid 01/30/2016  .  Gastroesophageal reflux disease without esophagitis 01/30/2016  . Acid reflux 01/30/2016  . Cervical sympathetic dystrophy 01/30/2016  . BP (high blood pressure) 01/30/2016  . Morbid obesity ([facility]) 01/30/2016  . Fracture of femur ([place]) 01/30/2016  . Ankle fracture 01/30/2016  . Fungal infection of toenail 01/30/2016  . Fracture of patella 01/30/2016  . Thyroid nodule 01/30/2016  . B12 deficiency 01/30/2016  . Avitaminosis D 01/30/2016  . 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 PCOS (polycystic ovarian syndrome) 09/16/2015  . Blood glucose elevated 07/20/2015  . H/O pneumothorax 07/20/2015  . Pneumothorax 04/26/2014  . Fracture of multiple ribs 04/26/2014  . Adiposity 04/26/2014  . Hernia, diaphragmatic, traumatic 04/26/2014   Past Medical History:  Diagnosis Date  . BRCA negative 09/2016   MyRisk neg; IBIS=13.3%/riskscore=13.4%  . Family history of ovarian cancer   . GERD (gastroesophageal reflux disease)   . Horner's syndrome   . PCOS (polycystic ovarian syndrome)   . Pre-diabetes   . Screening for colon cancer 09/2020   Neg cologuard; repeat after 3 yrs  . Vitamin D deficiency       Medications: Outpatient Medications Prior to Visit  Medication Sig  . acetaminophen (TYLENOL) 325 MG tablet Take 2 tablets (650 mg total) by mouth every 8 (eight) hours as needed for mild pain.  . calcium citrate-vitamin D 500-400 MG-UNIT chewable tablet Chew 1 tablet by mouth as directed.   Marland Kitchen DEXILANT 60 MG capsule TAKE 1 CAPSULE (60 MG TOTAL) BY MOUTH DAILY. PLEASE SCHEDULE AN OFFICE VISIT BEFORE ANYMORE REFILLS.  Marland Kitchen HYDROcodone-acetaminophen (NORCO) 5-325 MG tablet Take 1 tablet by mouth every 6 (six) hours  as needed for up to 6 doses for moderate pain.  Marland Kitchen ibuprofen (ADVIL) 800 MG tablet Take 1 tablet (800 mg total) by mouth every 8 (eight) hours as needed for mild pain or moderate pain.  . Multiple Vitamin (MULTI-VITAMIN) tablet Take 1 tablet by mouth daily.   . prochlorperazine (COMPAZINE) 5 MG  tablet Take 5 mg by mouth 4 (four) times daily as needed for nausea/vomiting.  Marland Kitchen XIFAXAN 550 MG TABS tablet Take 550 mg by mouth 3 (three) times daily.   No facility-administered medications prior to visit.    Review of Systems  Last CBC Lab Results  Component Value Date   WBC 6.3 08/23/2020   HGB 10.9 (L) 08/23/2020   HCT 32.0 (L) 08/23/2020   MCV 87.7 08/23/2020   MCH 29.9 08/23/2020   RDW 12.5 08/23/2020   PLT 176 23/34/3568   Last metabolic panel Lab Results  Component Value Date   GLUCOSE 95 08/23/2020   NA 138 08/23/2020   K 3.6 08/23/2020   CL 103 08/23/2020   CO2 27 08/23/2020   BUN 5 (L) 08/23/2020   CREATININE 0.51 08/23/2020   GFRNONAA >60 08/23/2020   GFRAA >60 02/06/2020   CALCIUM 8.6 (L) 08/23/2020   PROT 8.6 (H) 08/21/2020   ALBUMIN 4.6 08/21/2020   LABGLOB 2.0 06/22/2019   AGRATIO 2.0 06/22/2019   BILITOT 1.9 (H) 08/21/2020   ALKPHOS 161 (H) 08/21/2020   AST 98 (H) 08/21/2020   ALT 48 (H) 08/21/2020   ANIONGAP 8 08/23/2020      Objective    There were no vitals taken for this visit. BP Readings from Last 3 Encounters:  08/24/20 (!) 141/80  08/11/20 110/80  03/17/20 122/82   Wt Readings from Last 3 Encounters:  08/21/20 170 lb (77.1 kg)  08/11/20 175 lb (79.4 kg)  03/17/20 176 lb 3.2 oz (79.9 kg)      Physical Exam Vitals reviewed.  Constitutional:      General: She is not in acute distress.    Appearance: Normal appearance. She is well-developed. She is not ill-appearing.  HENT:     Head: Normocephalic and atraumatic.  Pulmonary:     Effort: Pulmonary effort is normal. No respiratory distress.  Musculoskeletal:     Cervical back: Normal range of motion and neck supple.  Neurological:     Mental Status: She is alert.  Psychiatric:        Mood and Affect: Mood normal.        Behavior: Behavior normal.        Thought Content: Thought content normal.        Judgment: Judgment normal.        Assessment & Plan     1. SBO  (small bowel obstruction) ([place]) Completely resolved. Doing well. No complaints.    No follow-ups on file.     I discussed the assessment and treatment plan with the patient. The patient was provided an opportunity to ask questions and all were answered. The patient agreed with the plan and demonstrated an understanding of the instructions.   The patient was advised to call back or seek an in-person evaluation if the symptoms worsen or if the condition fails to improve as anticipated.  I provided 14 minutes of face-to-face time during this encounter via MyChart Video enabled encounter.  Reynolds Bowl, PA-C, have reviewed all documentation for this visit. The documentation on 09/13/20 for the exam, diagnosis, procedures, and orders are all accurate and complete.  [place] [facility] [phone] (phone) [phone] (fax)  [employer]

## 2020-09-05 NOTE — Telephone Encounter (Signed)
Requested medication (s) are due for refill today: yes  Requested medication (s) are on the active medication list: yes   Last refill: 08/13/2020  Future visit scheduled: no  Notes to clinic: Patient is scheduling follow up appointment    Requested Prescriptions  Pending Prescriptions Disp Refills   DEXILANT 60 MG capsule [Pharmacy Med Name: Wrigley DR 60 MG CAPSULE] 30 capsule 0    Sig: Take 1 capsule (60 mg total) by mouth daily. Please schedule an office visit before anymore refills.      Gastroenterology: Proton Pump Inhibitors Failed - 09/05/2020  1:30 AM      Failed - Valid encounter within last 12 months    Recent Outpatient Visits           1 year ago Annual physical exam   [facility] [place] M[place] M, [place]   4 years ago Acute midline thoracic back pain   [place], [place]   4 years ago Subclinical hypothyroidism   [facility] Margarita Rana, MD

## 2020-09-05 NOTE — Telephone Encounter (Signed)
Pt of aware of sample exceeding regular test weight. Says she was contacted already and has sent new sample already.

## 2020-09-07 ENCOUNTER — Telehealth (INDEPENDENT_AMBULATORY_CARE_PROVIDER_SITE_OTHER): Payer: BC Managed Care – PPO | Admitting: Physician Assistant

## 2020-09-07 DIAGNOSIS — K56609 Unspecified intestinal obstruction, unspecified as to partial versus complete obstruction: Secondary | ICD-10-CM | POA: Diagnosis not present

## 2020-09-07 DIAGNOSIS — Z1211 Encounter for screening for malignant neoplasm of colon: Secondary | ICD-10-CM

## 2020-09-07 HISTORY — DX: Encounter for screening for malignant neoplasm of colon: Z12.11

## 2020-09-12 ENCOUNTER — Encounter: Payer: Self-pay | Admitting: Obstetrics and Gynecology

## 2020-09-12 LAB — EXTERNAL GENERIC LAB PROCEDURE: COLOGUARD: NEGATIVE

## 2020-09-12 LAB — COLOGUARD: COLOGUARD: NEGATIVE

## 2020-09-12 NOTE — Telephone Encounter (Signed)
Pt aware of NEGATIVE Cologuard test results.

## 2020-09-13 ENCOUNTER — Encounter: Payer: Self-pay | Admitting: Physician Assistant

## 2020-09-21 ENCOUNTER — Ambulatory Visit
Admission: RE | Admit: 2020-09-21 | Discharge: 2020-09-21 | Disposition: A | Discharge status: Home / Self Care | Payer: BC Managed Care – PPO | Source: Ambulatory Visit | Attending: Obstetrics and Gynecology | Admitting: Obstetrics and Gynecology

## 2020-09-21 ENCOUNTER — Other Ambulatory Visit: Payer: Self-pay

## 2020-09-21 DIAGNOSIS — Z1231 Encounter for screening mammogram for malignant neoplasm of breast: Secondary | ICD-10-CM | POA: Diagnosis not present

## 2020-11-28 DIAGNOSIS — R1012 Left upper quadrant pain: Secondary | ICD-10-CM | POA: Diagnosis not present

## 2020-12-21 DIAGNOSIS — Z9884 Bariatric surgery status: Secondary | ICD-10-CM | POA: Diagnosis not present

## 2020-12-21 DIAGNOSIS — K9089 Other intestinal malabsorption: Secondary | ICD-10-CM | POA: Diagnosis not present

## 2020-12-21 DIAGNOSIS — Z6829 Body mass index (BMI) 29.0-29.9, adult: Secondary | ICD-10-CM | POA: Diagnosis not present

## 2020-12-21 DIAGNOSIS — K219 Gastro-esophageal reflux disease without esophagitis: Secondary | ICD-10-CM | POA: Diagnosis not present

## 2020-12-23 DIAGNOSIS — Z8719 Personal history of other diseases of the digestive system: Secondary | ICD-10-CM | POA: Diagnosis not present

## 2020-12-23 DIAGNOSIS — Z6829 Body mass index (BMI) 29.0-29.9, adult: Secondary | ICD-10-CM | POA: Diagnosis not present

## 2020-12-23 DIAGNOSIS — Z9884 Bariatric surgery status: Secondary | ICD-10-CM | POA: Diagnosis not present

## 2020-12-23 DIAGNOSIS — Z9889 Other specified postprocedural states: Secondary | ICD-10-CM | POA: Diagnosis not present

## 2020-12-23 DIAGNOSIS — K9089 Other intestinal malabsorption: Secondary | ICD-10-CM | POA: Diagnosis not present

## 2020-12-26 DIAGNOSIS — Z981 Arthrodesis status: Secondary | ICD-10-CM | POA: Diagnosis not present

## 2020-12-26 DIAGNOSIS — M25571 Pain in right ankle and joints of right foot: Secondary | ICD-10-CM | POA: Diagnosis not present

## 2020-12-26 DIAGNOSIS — M79671 Pain in right foot: Secondary | ICD-10-CM | POA: Diagnosis not present

## 2021-02-03 DIAGNOSIS — Z6829 Body mass index (BMI) 29.0-29.9, adult: Secondary | ICD-10-CM | POA: Diagnosis not present

## 2021-02-03 DIAGNOSIS — R109 Unspecified abdominal pain: Secondary | ICD-10-CM | POA: Diagnosis not present

## 2021-02-10 DIAGNOSIS — R109 Unspecified abdominal pain: Secondary | ICD-10-CM | POA: Diagnosis not present

## 2021-02-10 DIAGNOSIS — K219 Gastro-esophageal reflux disease without esophagitis: Secondary | ICD-10-CM | POA: Diagnosis not present

## 2021-04-21 DIAGNOSIS — K219 Gastro-esophageal reflux disease without esophagitis: Secondary | ICD-10-CM | POA: Diagnosis not present

## 2021-04-21 DIAGNOSIS — R1013 Epigastric pain: Secondary | ICD-10-CM | POA: Diagnosis not present

## 2021-04-21 DIAGNOSIS — Z6829 Body mass index (BMI) 29.0-29.9, adult: Secondary | ICD-10-CM | POA: Diagnosis not present

## 2021-06-15 DIAGNOSIS — Z20822 Contact with and (suspected) exposure to covid-19: Secondary | ICD-10-CM | POA: Diagnosis not present

## 2021-06-15 DIAGNOSIS — Z03818 Encounter for observation for suspected exposure to other biological agents ruled out: Secondary | ICD-10-CM | POA: Diagnosis not present
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
## 2021-08-15 ENCOUNTER — Other Ambulatory Visit (HOSPITAL_COMMUNITY)
Admission: RE | Admit: 2021-08-15 | Discharge: 2021-08-15 | Disposition: A | Discharge status: Home / Self Care | Payer: BC Managed Care – PPO | Source: Ambulatory Visit | Attending: Obstetrics and Gynecology | Admitting: Obstetrics and Gynecology

## 2021-08-15 ENCOUNTER — Ambulatory Visit (INDEPENDENT_AMBULATORY_CARE_PROVIDER_SITE_OTHER): Payer: BC Managed Care – PPO | Admitting: Obstetrics and Gynecology

## 2021-08-15 ENCOUNTER — Other Ambulatory Visit: Payer: Self-pay

## 2021-08-15 ENCOUNTER — Encounter: Payer: Self-pay | Admitting: Obstetrics and Gynecology

## 2021-08-15 VITALS — BP 140/90 | Ht 62.0 in | Wt 171.0 lb

## 2021-08-15 DIAGNOSIS — Z124 Encounter for screening for malignant neoplasm of cervix: Secondary | ICD-10-CM

## 2021-08-15 DIAGNOSIS — Z01419 Encounter for gynecological examination (general) (routine) without abnormal findings: Secondary | ICD-10-CM | POA: Diagnosis not present

## 2021-08-15 DIAGNOSIS — Z1151 Encounter for screening for human papillomavirus (HPV): Secondary | ICD-10-CM | POA: Diagnosis not present

## 2021-08-15 DIAGNOSIS — Z1231 Encounter for screening mammogram for malignant neoplasm of breast: Secondary | ICD-10-CM | POA: Diagnosis not present

## 2021-08-15 NOTE — Patient Instructions (Signed)
I value your feedback and you entrusting [place] with your care. If you get a [facility] patient survey, I would appreciate you taking the time to let us know about your experience today. Thank you!  [place] [facility]: [phone]

## 2021-08-18 LAB — CYTOLOGY - PAP
Comment: NEGATIVE
Diagnosis: UNDETERMINED — AB
High risk HPV: NEGATIVE

## 2021-08-25 ENCOUNTER — Other Ambulatory Visit: Payer: Self-pay | Admitting: Physician Assistant

## 2021-08-25 DIAGNOSIS — K219 Gastro-esophageal reflux disease without esophagitis: Secondary | ICD-10-CM

## 2021-09-04 ENCOUNTER — Ambulatory Visit: Payer: BC Managed Care – PPO | Admitting: Medical

## 2021-09-04 ENCOUNTER — Encounter: Payer: Self-pay | Admitting: Medical

## 2021-09-04 ENCOUNTER — Other Ambulatory Visit: Payer: Self-pay

## 2021-09-04 VITALS — BP 130/82 | HR 91 | Temp 97.9°F | Resp 16

## 2021-09-04 DIAGNOSIS — J0100 Acute maxillary sinusitis, unspecified: Secondary | ICD-10-CM

## 2021-09-04 MED ORDER — AMOXICILLIN-POT CLAVULANATE 875-125 MG PO TABS: 1.0000 | ORAL_TABLET | Freq: Two times a day (BID) | ORAL | 0 refills | Status: DC
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
## 2021-09-04 NOTE — Patient Instructions (Addendum)
Eustachian Tube Dysfunction Eustachian tube dysfunction refers to a condition in which a blockage develops in the narrow passage that connects the middle ear to the back of the nose (eustachian tube). The eustachian tube regulates air pressure in the middle ear by letting air move between the ear and nose. It also helps to drain fluid from the middle ear space. Eustachian tube dysfunction can affect one or both ears. When the eustachian tube does not function properly, air pressure, fluid, or both can build up in the middle ear. What are the causes? This condition occurs when the eustachian tube becomes blocked or cannot open normally. Common causes of this condition include: Ear infections. Colds and other infections that affect the nose, mouth, and throat (upper respiratory tract). Allergies. Irritation from cigarette smoke. Irritation from stomach acid coming up into the esophagus (gastroesophageal reflux). The esophagus is the part of the body that moves food from the mouth to the stomach. Sudden changes in air pressure, such as from descending in an airplane or scuba diving. Abnormal growths in the nose or throat, such as: Growths that line the nose (nasal polyps). Abnormal growth of cells (tumors). Enlarged tissue at the back of the throat (adenoids). What increases the risk? You are more likely to develop this condition if: You smoke. You are overweight. You are a child who has: Certain birth defects of the mouth, such as cleft palate. Large tonsils or adenoids. What are the signs or symptoms? Common symptoms of this condition include: A feeling of fullness in the ear. Ear pain. Clicking or popping noises in the ear. Ringing in the ear (tinnitus). Hearing loss. Loss of balance. Dizziness. Symptoms may get worse when the air pressure around you changes, such as when you travel to an area of high elevation, fly on an airplane, or go scuba diving. How is this diagnosed? This  condition may be diagnosed based on: Your symptoms. A physical exam of your ears, nose, and throat. Tests, such as those that measure: The movement of your eardrum. Your hearing (audiometry). How is this treated? Treatment depends on the cause and severity of your condition. In mild cases, you may relieve your symptoms by moving air into your ears. This is called "popping the ears." In more severe cases, or if you have symptoms of fluid in your ears, treatment may include: Medicines to relieve congestion (decongestants). Medicines that treat allergies (antihistamines). Nasal sprays or ear drops that contain medicines that reduce swelling (steroids). A procedure to drain the fluid in your eardrum. In this procedure, a small tube may be placed in the eardrum to: Drain the fluid. Restore the air in the middle ear space. A procedure to insert a balloon device through the nose to inflate the opening of the eustachian tube (balloon dilation). Follow these instructions at home: Lifestyle Do not do any of the following until your health care provider approves: Travel to high altitudes. Fly in airplanes. Work in a Pension scheme manager or room. Scuba dive. Do not use any products that contain nicotine or tobacco. These products include cigarettes, chewing tobacco, and vaping devices, such as e-cigarettes. If you need help quitting, ask your health care provider. Keep your ears dry. Wear fitted earplugs during showering and bathing. Dry your ears completely after. General instructions Take over-the-counter and prescription medicines only as told by your health care provider. Use techniques to help pop your ears as recommended by your health care provider. These may include: Chewing gum. Yawning. Frequent, forceful swallowing. Closing  your mouth, holding your nose closed, and gently blowing as if you are trying to blow air out of your nose. Keep all follow-up visits. This is important. Contact a  health care provider if: Your symptoms do not go away after treatment. Your symptoms come back after treatment. You are unable to pop your ears. You have: A fever. Pain in your ear. Pain in your head or neck. Fluid draining from your ear. Your hearing suddenly changes. You become very dizzy. You lose your balance. Get help right away if: You have a sudden, severe increase in any of your symptoms. Summary Eustachian tube dysfunction refers to a condition in which a blockage develops in the eustachian tube. It can be caused by ear infections, allergies, inhaled irritants, or abnormal growths in the nose or throat. Symptoms may include ear pain or fullness, hearing loss, or ringing in the ears. Mild cases are treated with techniques to unblock the ears, such as yawning or chewing gum. More severe cases are treated with medicines or procedures. This information is not intended to replace advice given to you by your health care provider. Make sure you discuss any questions you have with your health care provider. Document Revised: 12/05/2020 Document Reviewed: 12/05/2020 Elsevier Patient Education  2022 [place]. Sinusitis, Adult Sinusitis is inflammation of your sinuses. Sinuses are hollow spaces in the bones around your face. Your sinuses are located: Around your eyes. In the middle of your forehead. Behind your nose. In your cheekbones. Mucus normally drains out of your sinuses. When your nasal tissues become inflamed or swollen, mucus can become trapped or blocked. This allows bacteria, viruses, and fungi to grow, which leads to infection. Most infections of the sinuses are caused by a virus. Sinusitis can develop quickly. It can last for up to 4 weeks (acute) or for more than 12 weeks (chronic). Sinusitis often develops after a cold. What are the causes? This condition is caused by anything that creates swelling in the sinuses or stops mucus from draining. This  includes: Allergies. Asthma. Infection from bacteria or viruses. Deformities or blockages in your nose or sinuses. Abnormal growths in the nose (nasal polyps). Pollutants, such as chemicals or irritants in the air. Infection from fungi (rare). What increases the risk? You are more likely to develop this condition if you: Have a weak body defense system (immune system). Do a lot of swimming or diving. Overuse nasal sprays. Smoke. What are the signs or symptoms? The main symptoms of this condition are pain and a feeling of pressure around the affected sinuses. Other symptoms include: Stuffy nose or congestion. Thick drainage from your nose. Swelling and warmth over the affected sinuses. Headache. Upper toothache. A cough that may get worse at night. Extra mucus that collects in the throat or the back of the nose (postnasal drip). Decreased sense of smell and taste. Fatigue. A fever. Sore throat. Bad breath. How is this diagnosed? This condition is diagnosed based on: Your symptoms. Your medical history. A physical exam. Tests to find out if your condition is acute or chronic. This may include: Checking your nose for nasal polyps. Viewing your sinuses using a device that has a light (endoscope). Testing for allergies or bacteria. Imaging tests, such as an MRI or CT scan. In rare cases, a bone biopsy may be done to rule out more serious types of fungal sinus disease. How is this treated? Treatment for sinusitis depends on the cause and whether your condition is chronic or acute. If  caused by a virus, your symptoms should go away on their own within 10 days. You may be given medicines to relieve symptoms. They include: Medicines that shrink swollen nasal passages (topical intranasal decongestants). Medicines that treat allergies (antihistamines). A spray that eases inflammation of the nostrils (topical intranasal corticosteroids). Rinses that help get rid of thick mucus in  your nose (nasal saline washes). If caused by bacteria, your health care provider may recommend waiting to see if your symptoms improve. Most bacterial infections will get better without antibiotic medicine. You may be given antibiotics if you have: A severe infection. A weak immune system. If caused by narrow nasal passages or nasal polyps, you may need to have surgery. Follow these instructions at home: Medicines Take, use, or apply over-the-counter and prescription medicines only as told by your health care provider. These may include nasal sprays. If you were prescribed an antibiotic medicine, take it as told by your health care provider. Do not stop taking the antibiotic even if you start to feel better. Hydrate and humidify  Drink enough fluid to keep your urine pale yellow. Staying hydrated will help to thin your mucus. Use a cool mist humidifier to keep the humidity level in your home above 50%. Inhale steam for 10-15 minutes, 3-4 times a day, or as told by your health care provider. You can do this in the bathroom while a hot shower is running. Limit your exposure to cool or dry air. Rest Rest as much as possible. Sleep with your head raised (elevated). Make sure you get enough sleep each night. General instructions  Apply a warm, moist washcloth to your face 3-4 times a day or as told by your health care provider. This will help with discomfort. Wash your hands often with soap and water to reduce your exposure to germs. If soap and water are not available, use hand sanitizer. Do not smoke. Avoid being around people who are smoking (secondhand smoke). Keep all follow-up visits as told by your health care provider. This is important. Contact a health care provider if: You have a fever. Your symptoms get worse. Your symptoms do not improve within 10 days. Get help right away if: You have a severe headache. You have persistent vomiting. You have severe pain or swelling around  your face or eyes. You have vision problems. You develop confusion. Your neck is stiff. You have trouble breathing. Summary Sinusitis is soreness and inflammation of your sinuses. Sinuses are hollow spaces in the bones around your face. This condition is caused by nasal tissues that become inflamed or swollen. The swelling traps or blocks the flow of mucus. This allows bacteria, viruses, and fungi to grow, which leads to infection. If you were prescribed an antibiotic medicine, take it as told by your health care provider. Do not stop taking the antibiotic even if you start to feel better. Keep all follow-up visits as told by your health care provider. This is important. This information is not intended to replace advice given to you by your health care provider. Make sure you discuss any questions you have with your health care provider. Document Revised: 02/24/2018 Document Reviewed: 02/24/2018 Elsevier Patient Education  2022 [employer].

## 2021-09-22 ENCOUNTER — Ambulatory Visit
Admission: RE | Admit: 2021-09-22 | Discharge: 2021-09-22 | Disposition: A | Discharge status: Home / Self Care | Payer: BC Managed Care – PPO | Source: Ambulatory Visit | Attending: Obstetrics and Gynecology | Admitting: Obstetrics and Gynecology

## 2021-09-22 ENCOUNTER — Other Ambulatory Visit: Payer: Self-pay

## 2021-09-22 DIAGNOSIS — Z1231 Encounter for screening mammogram for malignant neoplasm of breast: Secondary | ICD-10-CM | POA: Diagnosis not present

## 2021-09-23 ENCOUNTER — Other Ambulatory Visit: Payer: Self-pay | Admitting: Physician Assistant

## 2021-09-23 DIAGNOSIS — K219 Gastro-esophageal reflux disease without esophagitis: Secondary | ICD-10-CM

## 2021-09-25 ENCOUNTER — Other Ambulatory Visit: Payer: Self-pay | Admitting: Obstetrics and Gynecology

## 2021-09-25 DIAGNOSIS — R928 Other abnormal and inconclusive findings on diagnostic imaging of breast: Secondary | ICD-10-CM

## 2021-09-25 DIAGNOSIS — R921 Mammographic calcification found on diagnostic imaging of breast: Secondary | ICD-10-CM

## 2021-09-28 ENCOUNTER — Other Ambulatory Visit: Payer: Self-pay

## 2021-09-28 ENCOUNTER — Ambulatory Visit: Payer: BC Managed Care – PPO | Admitting: Physician Assistant

## 2021-09-28 ENCOUNTER — Encounter: Payer: Self-pay | Admitting: Physician Assistant

## 2021-09-28 VITALS — BP 144/91 | HR 63 | Temp 98.2°F | Ht 62.5 in | Wt 178.5 lb

## 2021-09-28 DIAGNOSIS — Z1159 Encounter for screening for other viral diseases: Secondary | ICD-10-CM

## 2021-09-28 DIAGNOSIS — R030 Elevated blood-pressure reading, without diagnosis of hypertension: Secondary | ICD-10-CM

## 2021-09-28 DIAGNOSIS — K219 Gastro-esophageal reflux disease without esophagitis: Secondary | ICD-10-CM | POA: Diagnosis not present

## 2021-09-28 DIAGNOSIS — Z9884 Bariatric surgery status: Secondary | ICD-10-CM | POA: Diagnosis not present

## 2021-09-28 DIAGNOSIS — N898 Other specified noninflammatory disorders of vagina: Secondary | ICD-10-CM | POA: Diagnosis not present

## 2021-09-28 DIAGNOSIS — I10 Essential (primary) hypertension: Secondary | ICD-10-CM | POA: Insufficient documentation

## 2021-09-28 DIAGNOSIS — B370 Candidal stomatitis: Secondary | ICD-10-CM | POA: Diagnosis not present

## 2021-09-28 DIAGNOSIS — D649 Anemia, unspecified: Secondary | ICD-10-CM

## 2021-09-28 LAB — POCT URINALYSIS DIPSTICK
Appearance: NORMAL
Bilirubin, UA: NEGATIVE
Blood, UA: NEGATIVE
Glucose, UA: NEGATIVE
Ketones, UA: NEGATIVE
Leukocytes, UA: NEGATIVE
Nitrite, UA: NEGATIVE
Protein, UA: NEGATIVE
Spec Grav, UA: 1.01 (ref 1.010–1.025)
Urobilinogen, UA: 0.2 E.U./dL
pH, UA: 6 (ref 5.0–8.0)

## 2021-09-28 MED ORDER — NYSTATIN 100000 UNIT/ML MT SUSP: 5.0000 mL | Freq: Four times a day (QID) | OROMUCOSAL | 0 refills | Status: DC | PRN

## 2021-09-28 MED ORDER — ESOMEPRAZOLE MAGNESIUM 40 MG PO CPDR: 40.0000 mg | DELAYED_RELEASE_CAPSULE | Freq: Every day | ORAL | 1 refills | Status: DC

## 2021-09-28 NOTE — Assessment & Plan Note (Signed)
Rx nystatin mouthwash, use BID up to QID If not resolved, please call/message and can send in diflucan

## 2021-09-28 NOTE — Assessment & Plan Note (Signed)
Last GI visit 7/22. Pt would like to stop the cymbalta as she is unsure if it is helping-- advised she titrate off Switched to nexium, should be less costly monthly. Ok to take pepcid for breakthrough symptoms.

## 2021-09-28 NOTE — Assessment & Plan Note (Signed)
Historically, will check CBC

## 2021-09-28 NOTE — Progress Notes (Signed)
Established patient visit   [name]   DOB: [birth year]   45 y.o. Female  MRN: [id number] Visit Date: 09/28/2021  Today's healthcare provider: Mikey Kirschner, PA-C   Cc. Refills, black tongue x 1 week  Subjective    HPI  Bay is a 45 y/o female who presents today for refills on GERD medication, she does reports that it costs her $90 a month and wonders if there is an alternative. She was seeing a GI but would prefer to manage at PCP. Hx of bariatric surgery, SBO and pancreatitis, large 'inoperable' hernia, chronic GERD. 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   She reports she was recently treated w/ antibiotics for a sinus infection, which lead to a yeast infection and discharge which she treated w/ OTC medication. She wonders if there is any infection in her urine. This has resolved but she reports a 'black tongue' since the antibiotics. Improved w/ tongue scraping. Denies pain, itching, irritation, bleeding.   Medications: Outpatient Medications Prior to Visit  Medication Sig   calcium citrate-vitamin D 500-400 MG-UNIT chewable tablet Chew 1 tablet by mouth as directed.    DULoxetine (CYMBALTA) 30 MG capsule Take 30 mg by mouth daily.   famotidine (PEPCID) 20 MG tablet Take 2 tablets by mouth as needed.   Multiple Vitamin (MULTI-VITAMIN) tablet Take 1 tablet by mouth daily.    [DISCONTINUED] DEXILANT 60 MG capsule TAKE 1 CAPSULE (60 MG TOTAL) BY MOUTH DAILY. PLEASE SCHEDULE AN OFFICE VISIT BEFORE ANYMORE REFILLS.   [DISCONTINUED] amoxicillin-clavulanate (AUGMENTIN) 875-125 MG tablet Take 1 tablet by mouth 2 (two) times daily. (Patient not taking: Reported on 09/28/2021)   No facility-administered medications prior to visit.    Review of Systems  Constitutional:  Negative for fatigue and fever.  HENT:         Black tongue  Respiratory:  Negative for cough and shortness of breath.   Cardiovascular:   Negative for chest pain and leg swelling.  Gastrointestinal:        Acid reflux  Neurological:  Negative for dizziness and headaches.      Objective    Blood pressure (!) 144/91, pulse 63, temperature 98.2 F (36.8 C), temperature source Oral, height 5' 2.5" (1.588 m), weight 178 lb 8 oz (81 kg), SpO2 98 %.   Physical Exam Constitutional:      General: She is awake.     Appearance: She is well-developed.  HENT:     Head: Normocephalic.     Mouth/Throat:     Comments: Dark discoloration to posterior tongue, no erythema, bleeding Eyes:     Conjunctiva/sclera: Conjunctivae normal.  Cardiovascular:     Rate and Rhythm: Normal rate and regular rhythm.     Heart sounds: Normal heart sounds.  Pulmonary:     Effort: Pulmonary effort is normal.     Breath sounds: Normal breath sounds.  Skin:    General: Skin is warm.  Neurological:     Mental Status: She is alert and oriented to person, place, and time.  Psychiatric:        Attention and Perception: Attention normal.        Mood and Affect: Mood normal.        Speech: Speech normal.        Behavior: Behavior is cooperative.      No results found for any visits on 09/28/21.  Assessment & Plan  Problem List Items Addressed This Visit       Digestive   Gastroesophageal reflux disease without esophagitis - Primary    Last GI visit 7/22. Pt would like to stop the cymbalta as she is unsure if it is helping-- advised she titrate off Switched to nexium, should be less costly monthly. Ok to take pepcid for breakthrough symptoms.       Relevant Medications   esomeprazole (NEXIUM) 40 MG capsule   Other Relevant Orders   CBC   Thrush    Rx nystatin mouthwash, use BID up to QID If not resolved, please call/message and can send in diflucan      Relevant Medications   nystatin (MYCOSTATIN) 100000 UNIT/ML suspension     Other   Anemia    Historically, will check CBC      Elevated blood pressure reading    Pt will try  to check at home F/u 3 mo      Relevant Orders   Comprehensive Metabolic Panel (CMET)   Other Visit Diagnoses     History of bariatric surgery       Relevant Orders   Comprehensive Metabolic Panel (CMET)   Vitamin B12   CBC   Vaginal discharge       Relevant Orders   POCT Urinalysis Dipstick   Encounter for hepatitis C screening test for low risk patient       Relevant Orders   Hepatitis C antibody        Return in about 3 months (around 12/27/2021) for hypertension.      I, Mikey Kirschner, PA-C have reviewed all documentation for this visit. The documentation on  09/28/2021 for the exam, diagnosis, procedures, and orders are all accurate and complete.    Mikey Kirschner, PA-C  [facility] [phone] (phone) [phone] (fax)  [place]

## 2021-09-28 NOTE — Assessment & Plan Note (Signed)
Pt will try to check at home F/u 3 mo

## 2021-09-29 LAB — CBC
Hematocrit: 40.4 % (ref 34.0–46.6)
Hemoglobin: 13.3 g/dL (ref 11.1–15.9)
MCH: 29.5 pg (ref 26.6–33.0)
MCHC: 32.9 g/dL (ref 31.5–35.7)
MCV: 90 fL (ref 79–97)
Platelets: 252 10*3/uL (ref 150–450)
RBC: 4.51 x10E6/uL (ref 3.77–5.28)
RDW: 12.4 % (ref 11.7–15.4)
WBC: 4.8 10*3/uL (ref 3.4–10.8)

## 2021-09-29 LAB — COMPREHENSIVE METABOLIC PANEL
ALT: 9 IU/L (ref 0–32)
AST: 20 IU/L (ref 0–40)
Albumin/Globulin Ratio: 1.7 (ref 1.2–2.2)
Albumin: 4 g/dL (ref 3.8–4.8)
Alkaline Phosphatase: 91 IU/L (ref 44–121)
BUN/Creatinine Ratio: 23 (ref 9–23)
BUN: 13 mg/dL (ref 6–24)
Bilirubin Total: 0.3 mg/dL (ref 0.0–1.2)
CO2: 26 mmol/L (ref 20–29)
Calcium: 9.6 mg/dL (ref 8.7–10.2)
Chloride: 103 mmol/L (ref 96–106)
Creatinine, Ser: 0.56 mg/dL — ABNORMAL LOW (ref 0.57–1.00)
Globulin, Total: 2.3 g/dL (ref 1.5–4.5)
Glucose: 90 mg/dL (ref 70–99)
Potassium: 4.6 mmol/L (ref 3.5–5.2)
Sodium: 139 mmol/L (ref 134–144)
Total Protein: 6.3 g/dL (ref 6.0–8.5)
eGFR: 113 mL/min/{1.73_m2} (ref >59)

## 2021-09-29 LAB — VITAMIN B12: Vitamin B-12: 1450 pg/mL — ABNORMAL HIGH (ref 232–1245)

## 2021-09-29 LAB — HEPATITIS C ANTIBODY: Hep C Virus Ab: <0.1 s/co ratio (ref 0.0–0.9)

## 2021-10-03 ENCOUNTER — Ambulatory Visit
Admission: RE | Admit: 2021-10-03 | Discharge: 2021-10-03 | Disposition: A | Discharge status: Home / Self Care | Payer: BC Managed Care – PPO | Source: Ambulatory Visit | Attending: Obstetrics and Gynecology | Admitting: Obstetrics and Gynecology

## 2021-10-03 ENCOUNTER — Other Ambulatory Visit: Payer: Self-pay

## 2021-10-03 DIAGNOSIS — R928 Other abnormal and inconclusive findings on diagnostic imaging of breast: Secondary | ICD-10-CM | POA: Insufficient documentation

## 2021-10-03 DIAGNOSIS — R921 Mammographic calcification found on diagnostic imaging of breast: Secondary | ICD-10-CM | POA: Insufficient documentation

## 2021-10-03 DIAGNOSIS — R922 Inconclusive mammogram: Secondary | ICD-10-CM | POA: Diagnosis not present

## 2021-10-25 ENCOUNTER — Other Ambulatory Visit: Payer: Self-pay

## 2021-10-25 ENCOUNTER — Encounter: Payer: Self-pay | Admitting: Medical

## 2021-10-25 ENCOUNTER — Ambulatory Visit: Payer: BC Managed Care – PPO | Admitting: Medical

## 2021-10-25 VITALS — BP 136/88 | HR 70 | Temp 98.4°F | Resp 16

## 2021-10-25 DIAGNOSIS — Z20822 Contact with and (suspected) exposure to covid-19: Secondary | ICD-10-CM

## 2021-10-25 DIAGNOSIS — R051 Acute cough: Secondary | ICD-10-CM

## 2021-10-25 LAB — POC COVID19 BINAXNOW: SARS Coronavirus 2 Ag: NEGATIVE

## 2021-10-25 MED ORDER — BENZONATATE 100 MG PO CAPS: ORAL_CAPSULE | ORAL | 0 refills | Status: DC

## 2021-12-22 DIAGNOSIS — Z6832 Body mass index (BMI) 32.0-32.9, adult: Secondary | ICD-10-CM | POA: Diagnosis not present

## 2021-12-22 DIAGNOSIS — R1013 Epigastric pain: Secondary | ICD-10-CM | POA: Diagnosis not present

## 2022-01-05 DIAGNOSIS — S76312A Strain of muscle, fascia and tendon of the posterior muscle group at thigh level, left thigh, initial encounter: Secondary | ICD-10-CM | POA: Diagnosis not present

## 2022-01-05 DIAGNOSIS — M25562 Pain in left knee: Secondary | ICD-10-CM | POA: Diagnosis not present

## 2022-01-05 DIAGNOSIS — Z4789 Encounter for other orthopedic aftercare: Secondary | ICD-10-CM | POA: Diagnosis not present

## 2022-01-05 DIAGNOSIS — S8992XA Unspecified injury of left lower leg, initial encounter: Secondary | ICD-10-CM | POA: Diagnosis not present

## 2022-01-05 DIAGNOSIS — M1712 Unilateral primary osteoarthritis, left knee: Secondary | ICD-10-CM | POA: Diagnosis not present

## 2022-01-24 DIAGNOSIS — M2392 Unspecified internal derangement of left knee: Secondary | ICD-10-CM | POA: Diagnosis not present

## 2022-01-24 DIAGNOSIS — M25562 Pain in left knee: Secondary | ICD-10-CM | POA: Diagnosis not present

## 2022-01-25 DIAGNOSIS — M2392 Unspecified internal derangement of left knee: Secondary | ICD-10-CM | POA: Diagnosis not present

## 2022-01-25 DIAGNOSIS — M1712 Unilateral primary osteoarthritis, left knee: Secondary | ICD-10-CM | POA: Diagnosis not present

## 2022-01-30 DIAGNOSIS — M175 Other unilateral secondary osteoarthritis of knee: Secondary | ICD-10-CM | POA: Diagnosis not present

## 2022-01-31 ENCOUNTER — Other Ambulatory Visit: Payer: Self-pay | Admitting: Physician Assistant

## 2022-01-31 DIAGNOSIS — K219 Gastro-esophageal reflux disease without esophagitis: Secondary | ICD-10-CM

## 2022-04-04 ENCOUNTER — Ambulatory Visit
Admission: RE | Admit: 2022-04-04 | Discharge: 2022-04-04 | Disposition: A | Discharge status: Home / Self Care | Payer: BC Managed Care – PPO | Source: Ambulatory Visit | Attending: Obstetrics and Gynecology | Admitting: Obstetrics and Gynecology

## 2022-04-04 DIAGNOSIS — R921 Mammographic calcification found on diagnostic imaging of breast: Secondary | ICD-10-CM | POA: Insufficient documentation

## 2022-04-04 DIAGNOSIS — R928 Other abnormal and inconclusive findings on diagnostic imaging of breast: Secondary | ICD-10-CM | POA: Insufficient documentation

## 2022-04-05 ENCOUNTER — Encounter: Payer: Self-pay | Admitting: Obstetrics and Gynecology

## 2022-04-05 ENCOUNTER — Other Ambulatory Visit: Payer: Self-pay | Admitting: Obstetrics and Gynecology

## 2022-04-05 DIAGNOSIS — R921 Mammographic calcification found on diagnostic imaging of breast: Secondary | ICD-10-CM

## 2022-04-05 DIAGNOSIS — Z1231 Encounter for screening mammogram for malignant neoplasm of breast: Secondary | ICD-10-CM

## 2022-04-20 DIAGNOSIS — M175 Other unilateral secondary osteoarthritis of knee: Secondary | ICD-10-CM | POA: Diagnosis not present

## 2022-05-04 DIAGNOSIS — M1712 Unilateral primary osteoarthritis, left knee: Secondary | ICD-10-CM | POA: Diagnosis not present

## 2022-05-16 DIAGNOSIS — M2392 Unspecified internal derangement of left knee: Secondary | ICD-10-CM | POA: Diagnosis not present

## 2022-07-12 ENCOUNTER — Ambulatory Visit: Payer: Self-pay

## 2022-07-12 NOTE — Patient Instructions (Signed)
Visit Information  Thank you for taking time to visit with me today. Please don't hesitate to contact me if I can be of assistance to you.   Following are the goals we discussed today:   Goals Addressed             This Visit's Progress    RNCM: "I am trying to avoid a knee replacement"       Care Coordination Interventions: Reviewed provider established plan for pain management Discussed importance of adherence to all scheduled medical appointments Counseled on the importance of reporting any/all new or changed pain symptoms or management strategies to pain management provider Advised patient to report to care team affect of pain on daily activities Discussed use of relaxation techniques and/or diversional activities to assist with pain reduction (distraction, imagery, relaxation, massage, acupressure, TENS, heat, and cold application Reviewed with patient prescribed pharmacological and nonpharmacological pain relief strategies Advised patient to discuss unresolved pain, changes in level or intensity of pain with provider        RNCM: Effective Management of GERD       Care Coordination Interventions: Evaluation of current treatment plan related to GERD and patient's adherence to plan as established by provider Advised patient to call the office for changes in her GERD, questions or concerns Provided education to patient re: GERD, avoiding laying down 30 minutes to an hour after eating, avoiding artificial sugars and sweets, and information on GERD supplied in mychart for education and review  Reviewed medications with patient and discussed compliance. The patient has been taking Nexium and pepcid. Does not refills.  Reviewed scheduled/upcoming provider appointments including While on the phone with the patient the patient sent a message asking for an appointment to see the pcp for AWV and follow up Discussed plans with patient for ongoing care management follow up and provided  patient with direct contact information for care management team Advised patient to discuss changes in her sx and sx of GERD  with provider Screening for signs and symptoms of depression related to chronic disease state  Assessed social determinant of health barriers Review of the care coordination program and the reason for the call from the [facility]. Education provided on calling the [facility] for changes in her conditions, questions, or educational needs.              Please call the care guide team at [phone] if you need to schedule an appointment.   If you are experiencing a Mental Health or [place] or need someone to talk to, please call the Suicide and Crisis Lifeline: 988 call the [place] National Suicide Prevention Lifeline: (678)336-7836 or TTY: 430 801 6506 TTY (775)231-4897) to talk to a trained counselor call 1-800-273-TALK (toll free, 24 hour hotline)  Patient verbalizes understanding of instructions and care plan provided today and agrees to view in [place]. Active MyChart status and patient understanding of how to access instructions and care plan via MyChart confirmed with patient.     No further follow up required: the patient knows to call for changes or new needs Noreene Larsson RN, MSN, [place]  Mobile: [phone]    Chronic Knee Pain, Adult Knee pain that lasts longer than 3 months is called chronic knee pain. You may have pain in one or both knees. Symptoms of chronic knee pain may also include swelling and stiffness. The most common cause is age-related wear and tear (osteoarthritis) of your knee joint. Many conditions can cause chronic  knee pain. Treatment depends on the cause. The main treatments are physical therapy and weight loss. It may also be treated with medicines, injections, a knee sleeve or brace, and by using crutches. Rest, ice, pressure (compression), and elevation, also known as RICE therapy, may also be  recommended. Follow these instructions at home: If you have a knee sleeve or brace:  Wear the knee sleeve or brace as told by your doctor. Take it off only as told by your doctor. Loosen it if your toes: Tingle. Become numb. Turn cold and blue. Keep it clean. If the sleeve or brace is not waterproof: Do not let it get wet. Ask your doctor if you may take it off when you take a bath or shower. If not, cover it with a watertight covering. Managing pain, stiffness, and swelling     If told, put heat on your knee. Do this as often as told by your doctor. Use the heat source that your doctor recommends, such as a moist heat pack or a heating pad. If you have a removable knee sleeve or brace, take it off as told by your doctor. Place a towel between your skin and the heat source. Leave the heat on for 20-30 minutes. Take off the heat if your skin turns bright red. This is very important. If you cannot feel pain, heat, or cold, you have a greater risk of getting burned. If told, put ice on your knee. To do this: If you have a removable knee sleeve or brace, take it off as told by your doctor. Put ice in a plastic bag. Place a towel between your skin and the bag. Leave the ice on for 20 minutes, 2-3 times a day. Take off the ice if your skin turns bright red. This is very important. If you cannot feel pain, heat, or cold, you have a greater risk of damage to the area. Move your toes often. Raise the injured area above the level of your heart while you are sitting or lying down. Activity Avoid activities where both feet leave the ground at the same time (high-impact activities). Examples are running, jumping rope, and doing jumping jacks. Follow the exercise plan that your doctor makes for you. Your doctor may suggest that you: Avoid activities that make knee pain worse. You may need to change the exercises that you do, the sports that you participate in, or your job duties. Wear shoes with  cushioned soles. Avoid sports that require running and sudden changes in direction. Do exercises or physical therapy. This is planned to match your needs and your abilities. Do exercises that increase your balance and strength, such as tai chi and yoga. Do not use your injured knee to support your body weight until your doctor says that you can. Use crutches as told by your doctor. Return to your normal activities when your doctor says that it is safe. General instructions Take over-the-counter and prescription medicines only as told by your doctor. If you are overweight, work with your doctor and a food expert (dietitian) to set goals to lose weight. Being overweight can make your knee hurt more. Do not smoke or use any products that contain nicotine or tobacco. If you need help quitting, ask your doctor. Keep all follow-up visits. Contact a doctor if: You have knee pain that is not getting better or gets worse. You are not able to do your exercises due to knee pain. Get help right away if: Your knee swells and  the swelling gets worse. You cannot move your knee. You have very bad knee pain. Summary Knee pain that lasts more than 3 months is called chronic knee pain. The main treatments for chronic knee pain are physical therapy and weight loss. You may also need to take medicines, wear a knee sleeve or brace, use crutches, and put ice or heat on your knee. Lose weight if you are overweight. Work with your doctor and a food expert (dietitian) to help you set goals to lose weight. Being overweight can make your knee hurt more. Follow the exercise plan that your doctor makes for you. This information is not intended to replace advice given to you by your health care provider. Make sure you discuss any questions you have with your health care provider. Document Revised: 03/09/2020 Document Reviewed: 03/09/2020 Elsevier Patient Education  [place] for  Gastroesophageal Reflux Disease, Adult When you have gastroesophageal reflux disease (GERD), the foods you eat and your eating habits are very important. Choosing the right foods can help ease your discomfort. Think about working with a food expert (dietitian) to help you make good choices. What are tips for following this plan? Reading food labels Look for foods that are low in saturated fat. Foods that may help with your symptoms include: Foods that have less than 5% of daily value (DV) of fat. Foods that have 0 grams of trans fat. Cooking Do not fry your food. Cook your food by baking, steaming, grilling, or broiling. These are all methods that do not need a lot of fat for cooking. To add flavor, try to use herbs that are low in spice and acidity. Meal planning  Choose healthy foods that are low in fat, such as: Fruits and vegetables. Whole grains. Low-fat dairy products. Lean meats, fish, and poultry. Eat small meals often instead of eating 3 large meals each day. Eat your meals slowly in a place where you are relaxed. Avoid bending over or lying down until 2-3 hours after eating. Limit high-fat foods such as fatty meats or fried foods. Limit your intake of fatty foods, such as oils, butter, and shortening. Avoid the following as told by your doctor: Foods that cause symptoms. These may be different for different people. Keep a food diary to keep track of foods that cause symptoms. Alcohol. Drinking a lot of liquid with meals. Eating meals during the 2-3 hours before bed. Lifestyle Stay at a healthy weight. Ask your doctor what weight is healthy for you. If you need to lose weight, work with your doctor to do so safely. Exercise for at least 30 minutes on 5 or more days each week, or as told by your doctor. Wear loose-fitting clothes. Do not smoke or use any products that contain nicotine or tobacco. If you need help quitting, ask your doctor. Sleep with the head of your bed higher  than your feet. Use a wedge under the mattress or blocks under the bed frame to raise the head of the bed. Chew sugar-free gum after meals. What foods should eat?  Eat a healthy, well-balanced diet of fruits, vegetables, whole grains, low-fat dairy products, lean meats, fish, and poultry. Each person is different. Foods that may cause symptoms in one person may not cause any symptoms in another person. Work with your doctor to find foods that are safe for you. The items listed above may not be a complete list of what you can eat and drink. Contact a  food expert for more options. What foods should I avoid? Limiting some of these foods may help in managing the symptoms of GERD. Everyone is different. Talk with a food expert or your doctor to help you find the exact foods to avoid, if any. Fruits Any fruits prepared with added fat. Any fruits that cause symptoms. For some people, this may include citrus fruits, such as oranges, grapefruit, pineapple, and lemons. Vegetables Deep-fried vegetables. [place] fries. Any vegetables prepared with added fat. Any vegetables that cause symptoms. For some people, this may include tomatoes and tomato products, chili peppers, onions and garlic, and horseradish. Grains Pastries or quick breads with added fat. Meats and other proteins High-fat meats, such as fatty beef or pork, hot dogs, ribs, ham, sausage, salami, and bacon. Fried meat or protein, including fried fish and fried chicken. Nuts and nut butters, in large amounts. Dairy Whole milk and chocolate milk. Sour cream. Cream. Ice cream. Cream cheese. Milkshakes. Fats and oils Butter. Margarine. Shortening. Ghee. Beverages Coffee and tea, with or without caffeine. Carbonated beverages. Sodas. Energy drinks. Fruit juice made with acidic fruits, such as orange or grapefruit. Tomato juice. Alcoholic drinks. Sweets and desserts Chocolate and cocoa. Donuts. Seasonings and condiments Pepper. Peppermint and  spearmint. Added salt. Any condiments, herbs, or seasonings that cause symptoms. For some people, this may include curry, hot sauce, or vinegar-based salad dressings. The items listed above may not be a complete list of what you should not eat and drink. Contact a food expert for more options. Questions to ask your doctor Diet and lifestyle changes are often the first steps that are taken to manage symptoms of GERD. If diet and lifestyle changes do not help, talk with your doctor about taking medicines. Where to find more information International Foundation for Gastrointestinal Disorders: aboutgerd.org Summary When you have GERD, food and lifestyle choices are very important in easing your symptoms. Eat small meals often instead of 3 large meals a day. Eat your meals slowly and in a place where you are relaxed. Avoid bending over or lying down until 2-3 hours after eating. Limit high-fat foods such as fatty meats or fried foods. This information is not intended to replace advice given to you by your health care provider. Make sure you discuss any questions you have with your health care provider. Document Revised: 04/04/2020 Document Reviewed: 04/04/2020 Elsevier Patient Education  [place].

## 2022-07-12 NOTE — Patient Outreach (Signed)
  Care Coordination   Initial Visit Note   07/12/2022 Name: Mariah Martin MRN: [id number] DOB: 05/25/1976  Mariah Martin is a 46 y.o. year old female who sees Thedore Mins, [place]ia Comment, [place] for primary care. I spoke with  Mariah Martin by phone today.  What matters to the patients health and wellness today?  GERD management and trying to avoid knee replacement surgery    Goals Addressed             This Visit's Progress    RNCM: "I am trying to avoid a knee replacement"       Care Coordination Interventions: Reviewed provider established plan for pain management Discussed importance of adherence to all scheduled medical appointments Counseled on the importance of reporting any/all new or changed pain symptoms or management strategies to pain management provider Advised patient to report to care team affect of pain on daily activities Discussed use of relaxation techniques and/or diversional activities to assist with pain reduction (distraction, imagery, relaxation, massage, acupressure, TENS, heat, and cold application Reviewed with patient prescribed pharmacological and nonpharmacological pain relief strategies Advised patient to discuss unresolved pain, changes in level or intensity of pain with provider        RNCM: Effective Management of GERD       Care Coordination Interventions: Evaluation of current treatment plan related to GERD and patient's adherence to plan as established by provider Advised patient to call the office for changes in her GERD, questions or concerns Provided education to patient re: GERD, avoiding laying down 30 minutes to an hour after eating, avoiding artificial sugars and sweets, and information on GERD supplied in mychart for education and review  Reviewed medications with patient and discussed compliance. The patient has been taking Nexium and pepcid. Does not refills.  Reviewed scheduled/upcoming provider appointments including While on the  phone with the patient the patient sent a message asking for an appointment to see the pcp for AWV and follow up Discussed plans with patient for ongoing care management follow up and provided patient with direct contact information for care management team Advised patient to discuss changes in her sx and sx of GERD  with provider Screening for signs and symptoms of depression related to chronic disease state  Assessed social determinant of health barriers Review of the care coordination program and the reason for the call from the [facility]. Education provided on calling the [facility] for changes in her conditions, questions, or educational needs.            SDOH assessments and interventions completed:  Yes  SDOH Interventions Today    Flowsheet Row Most Recent Value  SDOH Interventions   Food Insecurity Interventions Intervention Not Indicated  Housing Interventions Intervention Not Indicated  Transportation Interventions Intervention Not Indicated  Utilities Interventions Intervention Not Indicated  Financial Strain Interventions Intervention Not Indicated        Care Coordination Interventions Activated:  Yes  Care Coordination Interventions:  Yes, provided   Follow up plan: No further intervention required.   Encounter Outcome:  Pt. Visit Completed   Noreene Larsson RN, MSN, [place]  Mobile: [phone]

## 2022-07-27 ENCOUNTER — Other Ambulatory Visit: Payer: Self-pay | Admitting: Physician Assistant

## 2022-07-27 DIAGNOSIS — K219 Gastro-esophageal reflux disease without esophagitis: Secondary | ICD-10-CM

## 2022-07-27 NOTE — Telephone Encounter (Signed)
Requested Prescriptions  Pending Prescriptions Disp Refills  . esomeprazole (NEXIUM) 40 MG capsule [Pharmacy Med Name: ESOMEPRAZOLE MAG DR 40 MG CAP] 90 capsule 0    Sig: TAKE 1 CAPSULE (40 MG TOTAL) BY MOUTH DAILY AT 12 NOON.     Gastroenterology: Proton Pump Inhibitors 2 Passed - 07/27/2022  1:29 AM      Passed - ALT in normal range and within 360 days    ALT  Date Value Ref Range Status  09/28/2021 9 0 - 32 IU/L Final         Passed - AST in normal range and within 360 days    AST  Date Value Ref Range Status  09/28/2021 20 0 - 40 IU/L Final         Passed - Valid encounter within last 12 months    Recent Outpatient Visits          10 months ago Gastroesophageal reflux disease without esophagitis   [facility] Thedore Mins, [place], PA-C   1 year ago SBO (small bowel obstruction) [facility])   [facility] [place], Clearnce Sorrel, [place]   3 years ago Annual physical exam   [place], [place]   6 years ago Acute midline thoracic back pain   [place], [place]   6 years ago Subclinical hypothyroidism   [facility] Margarita Rana, MD      Future Appointments            In 1 week Thedore Mins, Ria [place], PA-C [facility], [place]

## 2022-08-07 ENCOUNTER — Encounter: Payer: Self-pay | Admitting: Physician Assistant

## 2022-08-07 ENCOUNTER — Ambulatory Visit (INDEPENDENT_AMBULATORY_CARE_PROVIDER_SITE_OTHER): Payer: BC Managed Care – PPO | Admitting: Physician Assistant

## 2022-08-07 VITALS — BP 133/82 | HR 61 | Ht 62.5 in | Wt 193.8 lb

## 2022-08-07 DIAGNOSIS — K219 Gastro-esophageal reflux disease without esophagitis: Secondary | ICD-10-CM | POA: Diagnosis not present

## 2022-08-07 DIAGNOSIS — M25562 Pain in left knee: Secondary | ICD-10-CM

## 2022-08-07 DIAGNOSIS — G8929 Other chronic pain: Secondary | ICD-10-CM | POA: Diagnosis not present

## 2022-08-07 DIAGNOSIS — Z0000 Encounter for general adult medical examination without abnormal findings: Secondary | ICD-10-CM

## 2022-08-07 DIAGNOSIS — R350 Frequency of micturition: Secondary | ICD-10-CM | POA: Diagnosis not present

## 2022-08-07 LAB — POCT URINALYSIS DIPSTICK
Bilirubin, UA: NEGATIVE
Blood, UA: NEGATIVE
Glucose, UA: NEGATIVE
Ketones, UA: NEGATIVE
Leukocytes, UA: NEGATIVE
Nitrite, UA: NEGATIVE
Protein, UA: NEGATIVE
Spec Grav, UA: 1.01 (ref 1.010–1.025)
Urobilinogen, UA: 0.2 E.U./dL
pH, UA: 6.5 (ref 5.0–8.0)

## 2022-08-07 MED ORDER — CELECOXIB 100 MG PO CAPS: 100.0000 mg | ORAL_CAPSULE | Freq: Two times a day (BID) | ORAL | 0 refills | Status: AC

## 2022-08-07 NOTE — Assessment & Plan Note (Signed)
W/ urge incontinence. Sounds like OAB, discussed medication, bladder training. Pt will f/u if needed for more information UA today neg, will check culture to r/o UTI
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
## 2022-08-08 LAB — CBC WITH DIFFERENTIAL/PLATELET
Basophils Absolute: 0 10*3/uL (ref 0.0–0.2)
Basos: 1 %
EOS (ABSOLUTE): 0.1 10*3/uL (ref 0.0–0.4)
Eos: 1 %
Hematocrit: 39.3 % (ref 34.0–46.6)
Hemoglobin: 13.2 g/dL (ref 11.1–15.9)
Immature Grans (Abs): 0 10*3/uL (ref 0.0–0.1)
Immature Granulocytes: 0 %
Lymphocytes Absolute: 1.4 10*3/uL (ref 0.7–3.1)
Lymphs: 27 %
MCH: 29.4 pg (ref 26.6–33.0)
MCHC: 33.6 g/dL (ref 31.5–35.7)
MCV: 88 fL (ref 79–97)
Monocytes Absolute: 0.5 10*3/uL (ref 0.1–0.9)
Monocytes: 10 %
Neutrophils Absolute: 3 10*3/uL (ref 1.4–7.0)
Neutrophils: 61 %
Platelets: 218 10*3/uL (ref 150–450)
RBC: 4.49 x10E6/uL (ref 3.77–5.28)
RDW: 11.8 % (ref 11.7–15.4)
WBC: 5 10*3/uL (ref 3.4–10.8)

## 2022-08-08 LAB — COMPREHENSIVE METABOLIC PANEL
ALT: 18 IU/L (ref 0–32)
AST: 23 IU/L (ref 0–40)
Albumin/Globulin Ratio: 2.1 (ref 1.2–2.2)
Albumin: 4 g/dL (ref 3.9–4.9)
Alkaline Phosphatase: 85 IU/L (ref 44–121)
BUN/Creatinine Ratio: 23 (ref 9–23)
BUN: 14 mg/dL (ref 6–24)
Bilirubin Total: 0.3 mg/dL (ref 0.0–1.2)
CO2: 24 mmol/L (ref 20–29)
Calcium: 9.7 mg/dL (ref 8.7–10.2)
Chloride: 104 mmol/L (ref 96–106)
Creatinine, Ser: 0.61 mg/dL (ref 0.57–1.00)
Globulin, Total: 1.9 g/dL (ref 1.5–4.5)
Glucose: 96 mg/dL (ref 70–99)
Potassium: 4.9 mmol/L (ref 3.5–5.2)
Sodium: 140 mmol/L (ref 134–144)
Total Protein: 5.9 g/dL — ABNORMAL LOW (ref 6.0–8.5)
eGFR: 110 mL/min/{1.73_m2} (ref >59)

## 2022-08-08 LAB — LIPID PANEL
Chol/HDL Ratio: 2.2 ratio (ref 0.0–4.4)
Cholesterol, Total: 196 mg/dL (ref 100–199)
HDL: 90 mg/dL (ref >39)
LDL Chol Calc (NIH): 84 mg/dL (ref 0–99)
Triglycerides: 129 mg/dL (ref 0–149)
VLDL Cholesterol Cal: 22 mg/dL (ref 5–40)

## 2022-08-08 LAB — TSH: TSH: 2.32 u[IU]/mL (ref 0.450–4.500)

## 2022-08-10 ENCOUNTER — Other Ambulatory Visit: Payer: Self-pay | Admitting: Physician Assistant

## 2022-08-10 MED ORDER — SULFAMETHOXAZOLE-TRIMETHOPRIM 800-160 MG PO TABS: 1.0000 | ORAL_TABLET | Freq: Two times a day (BID) | ORAL | 0 refills | Status: AC

## 2022-08-11 LAB — SPECIMEN STATUS REPORT

## 2022-08-11 LAB — URINE CULTURE

## 2022-10-05 ENCOUNTER — Ambulatory Visit
Admission: RE | Admit: 2022-10-05 | Discharge: 2022-10-05 | Disposition: A | Discharge status: Home / Self Care | Payer: BC Managed Care – PPO | Source: Ambulatory Visit | Attending: Obstetrics and Gynecology | Admitting: Obstetrics and Gynecology

## 2022-10-05 ENCOUNTER — Other Ambulatory Visit: Payer: Self-pay | Admitting: Obstetrics and Gynecology

## 2022-10-05 DIAGNOSIS — R921 Mammographic calcification found on diagnostic imaging of breast: Secondary | ICD-10-CM | POA: Diagnosis not present

## 2022-10-05 DIAGNOSIS — R922 Inconclusive mammogram: Secondary | ICD-10-CM | POA: Diagnosis not present

## 2022-10-05 DIAGNOSIS — Z1231 Encounter for screening mammogram for malignant neoplasm of breast: Secondary | ICD-10-CM

## 2022-10-05 DIAGNOSIS — R928 Other abnormal and inconclusive findings on diagnostic imaging of breast: Secondary | ICD-10-CM

## 2022-10-17 ENCOUNTER — Ambulatory Visit
Admission: RE | Admit: 2022-10-17 | Discharge: 2022-10-17 | Disposition: A | Discharge status: Home / Self Care | Payer: BC Managed Care – PPO | Source: Ambulatory Visit | Attending: Obstetrics and Gynecology | Admitting: Obstetrics and Gynecology

## 2022-10-17 DIAGNOSIS — R921 Mammographic calcification found on diagnostic imaging of breast: Secondary | ICD-10-CM

## 2022-10-17 DIAGNOSIS — R928 Other abnormal and inconclusive findings on diagnostic imaging of breast: Secondary | ICD-10-CM

## 2022-10-17 DIAGNOSIS — N6011 Diffuse cystic mastopathy of right breast: Secondary | ICD-10-CM | POA: Diagnosis not present

## 2022-10-17 HISTORY — PX: BREAST BIOPSY: SHX20

## 2022-10-17 MED ORDER — LIDOCAINE-EPINEPHRINE 1 %-1:100000 IJ SOLN
10.0000 mL | Freq: Once | INTRAMUSCULAR | Status: AC
  Administered 2022-10-17: 10 mL

## 2022-10-17 MED ORDER — LIDOCAINE HCL (PF) 1 % IJ SOLN
15.0000 mL | Freq: Once | INTRAMUSCULAR | Status: AC
  Administered 2022-10-17: 15 mL

## 2022-10-18 LAB — SURGICAL PATHOLOGY

## 2022-10-22 ENCOUNTER — Other Ambulatory Visit: Payer: Self-pay | Admitting: Physician Assistant

## 2022-10-22 DIAGNOSIS — K219 Gastro-esophageal reflux disease without esophagitis: Secondary | ICD-10-CM

## 2022-12-03 ENCOUNTER — Ambulatory Visit (INDEPENDENT_AMBULATORY_CARE_PROVIDER_SITE_OTHER): Payer: Self-pay | Admitting: Physician Assistant

## 2022-12-03 VITALS — BP 126/72 | HR 66 | Temp 97.9°F | Wt 194.0 lb

## 2022-12-03 DIAGNOSIS — R051 Acute cough: Secondary | ICD-10-CM

## 2022-12-03 DIAGNOSIS — R062 Wheezing: Secondary | ICD-10-CM

## 2022-12-03 DIAGNOSIS — K529 Noninfective gastroenteritis and colitis, unspecified: Secondary | ICD-10-CM

## 2022-12-03 DIAGNOSIS — J069 Acute upper respiratory infection, unspecified: Secondary | ICD-10-CM

## 2022-12-03 LAB — POC SOFIA 2 FLU + SARS ANTIGEN FIA
Influenza A, POC: NEGATIVE
Influenza B, POC: NEGATIVE
SARS Coronavirus 2 Ag: NEGATIVE

## 2022-12-03 MED ORDER — ALBUTEROL SULFATE HFA 108 (90 BASE) MCG/ACT IN AERS: 2.0000 | INHALATION_SPRAY | Freq: Four times a day (QID) | RESPIRATORY_TRACT | 1 refills | Status: DC | PRN

## 2022-12-03 NOTE — Progress Notes (Signed)
[facility] [address], [place] [address]   Office Visit Note  Patient Name: Mariah Martin Date of Birth [id number]  Medical Record number [id number]  Date of Service: 12/03/2022  Chief Complaint  Patient presents with   Nasal Congestion   Ear Fullness    Woke up Sat. With diarrhea no fever, congested started later Sat. Does get pneumonia  quickly. Ears painful pop just when blowing nose. Woke up soaking wet this morning. Neg. COVID test Sat.      47 y/o F presents to the clinic for congestion, cough, x 2 days. Initially started with diarrhea after eating unusual diet the day before. Diarrhea and abdominal pain resolved, but congestion has continued. Has taken otc tylenol and sinus congestion products. She denies fever, chills, CP, SOB, body aches.   Ear Fullness  Associated symptoms include coughing. Pertinent negatives include no sore throat.      Current Medication:  Outpatient Encounter Medications as of 12/03/2022  Medication Sig   albuterol (VENTOLIN HFA) 108 (90 Base) MCG/ACT inhaler Inhale 2 puffs into the lungs every 6 (six) hours as needed for wheezing or shortness of breath.   esomeprazole (NEXIUM) 40 MG capsule TAKE 1 CAPSULE (40 MG TOTAL) BY MOUTH DAILY AT 12 NOON.   Multiple Vitamin (MULTI-VITAMIN) tablet Take 1 tablet by mouth daily.    No facility-administered encounter medications on file as of 12/03/2022.      Medical History: Past Medical History:  Diagnosis Date   Ankle fracture 01/30/2016   Arthritis 2016   Avascular necrosis ([place]) 07/29/2018   Added automatically from request for surgery [id number]   BRCA negative 09/2016   MyRisk neg; IBIS=13.3%/riskscore=13.4%   Family history of ovarian cancer    Fracture of femur ([place]) 01/30/2016   Fracture of multiple ribs 04/26/2014   Fracture of patella 01/30/2016   Fungal infection of toenail 01/30/2016   GERD (gastroesophageal reflux disease)    Horner's syndrome    Horner's  syndrome 01/30/2016   PCOS (polycystic ovarian syndrome)    Pneumothorax 04/26/2014   Pre-diabetes    SBO (small bowel obstruction) ([place]) 08/21/2020   Screening for colon cancer 09/2020   Neg cologuard; repeat after 3 yrs   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   HENT:  Positive for congestion and postnasal drip. Negative for sinus pressure, sinus pain, sore throat and trouble swallowing.   Respiratory:  Positive for cough. Negative for chest tightness, shortness of breath and wheezing.   Cardiovascular: Negative.   Neurological: Negative.     Physical Exam Constitutional:      Appearance: Normal appearance.  HENT:     Head: Atraumatic.     Right Ear: Tympanic membrane, ear canal and external ear normal.     Left Ear: Tympanic membrane, ear canal and external ear normal.     Nose: Nose normal.     Mouth/Throat:     Mouth: Mucous membranes are moist.     Pharynx: Oropharynx is clear.  Eyes:     Extraocular Movements: Extraocular movements intact.  Cardiovascular:     Rate and Rhythm: Normal rate and regular rhythm.  Pulmonary:     Effort: Pulmonary effort is normal.     Breath sounds: Wheezing (inspiratory wheezing both lung fields) present.  Musculoskeletal:     Cervical back: Neck supple.  Skin:    General: Skin is warm.  Neurological:     Mental Status: She is alert.  Psychiatric:        Mood and Affect: Mood normal.        Behavior: Behavior normal.        Thought Content: Thought content normal.        Judgment: Judgment normal.       Assessment/Plan:  1. Acute cough - POC SOFIA 2 FLU + SARS ANTIGEN FIA  2. Viral upper respiratory tract infection  3. Wheezing - albuterol  (VENTOLIN HFA) 108 (90 Base) MCG/ACT inhaler; Inhale 2 puffs into the lungs every 6 (six) hours as needed for wheezing or shortness of breath.  Dispense: 8 g; Refill: 1  4. Gastroenteritis  Reviewed negative covid and flu test results Increase fluids Continue with humidifier Start otc oral decongestant ie Sudafed as directed on the box Take otc Mucinex-DM for cough If cough and wheezing persist then consider using albuterol inhaler as prescribed. Pt verbalized understanding and in agreement.   General Counseling: Enijah verbalizes understanding of the findings of todays visit and agrees with plan of treatment. I have discussed any further diagnostic evaluation that may be needed or ordered today. We also reviewed her medications today. she has been encouraged to call the office with any questions or concerns that should arise related to todays visit.    Time spent:[address] [place], [place] Physician Assistant

## 2023-03-23 DIAGNOSIS — M19071 Primary osteoarthritis, right ankle and foot: Secondary | ICD-10-CM | POA: Diagnosis not present

## 2023-04-15 DIAGNOSIS — M19071 Primary osteoarthritis, right ankle and foot: Secondary | ICD-10-CM | POA: Diagnosis not present

## 2023-04-15 DIAGNOSIS — M19171 Post-traumatic osteoarthritis, right ankle and foot: Secondary | ICD-10-CM | POA: Diagnosis not present

## 2023-05-21 DIAGNOSIS — M19071 Primary osteoarthritis, right ankle and foot: Secondary | ICD-10-CM | POA: Diagnosis not present

## 2023-06-05 DIAGNOSIS — M25562 Pain in left knee: Secondary | ICD-10-CM | POA: Diagnosis not present

## 2023-06-05 DIAGNOSIS — G8929 Other chronic pain: Secondary | ICD-10-CM | POA: Diagnosis not present

## 2023-06-05 DIAGNOSIS — M1732 Unilateral post-traumatic osteoarthritis, left knee: Secondary | ICD-10-CM | POA: Diagnosis not present

## 2023-06-07 ENCOUNTER — Encounter: Payer: Self-pay | Admitting: Family Medicine

## 2023-06-07 ENCOUNTER — Ambulatory Visit: Payer: BC Managed Care – PPO | Admitting: Family Medicine

## 2023-06-07 ENCOUNTER — Ambulatory Visit: Payer: BC Managed Care – PPO | Admitting: Obstetrics and Gynecology

## 2023-06-07 VITALS — BP 126/72 | HR 76 | Temp 97.8°F | Resp 12 | Ht 62.5 in | Wt 203.5 lb

## 2023-06-07 DIAGNOSIS — R002 Palpitations: Secondary | ICD-10-CM | POA: Diagnosis not present

## 2023-06-07 DIAGNOSIS — R350 Frequency of micturition: Secondary | ICD-10-CM | POA: Diagnosis not present

## 2023-06-07 DIAGNOSIS — R631 Polydipsia: Secondary | ICD-10-CM

## 2023-06-07 DIAGNOSIS — K219 Gastro-esophageal reflux disease without esophagitis: Secondary | ICD-10-CM

## 2023-06-07 DIAGNOSIS — R5383 Other fatigue: Secondary | ICD-10-CM | POA: Diagnosis not present

## 2023-06-07 LAB — POCT URINALYSIS DIPSTICK
Bilirubin, UA: NEGATIVE
Blood, UA: NEGATIVE
Glucose, UA: NEGATIVE
Ketones, UA: NEGATIVE
Leukocytes, UA: NEGATIVE
Nitrite, UA: NEGATIVE
Protein, UA: POSITIVE — AB
Spec Grav, UA: 1.02 (ref 1.010–1.025)
Urobilinogen, UA: 0.2 E.U./dL
pH, UA: 6 (ref 5.0–8.0)

## 2023-06-07 LAB — POCT GLYCOSYLATED HEMOGLOBIN (HGB A1C): Hemoglobin A1C: 4.8 % (ref 4.0–5.6)

## 2023-06-11 ENCOUNTER — Other Ambulatory Visit: Payer: Self-pay

## 2023-06-11 DIAGNOSIS — R5383 Other fatigue: Secondary | ICD-10-CM

## 2023-06-11 DIAGNOSIS — R631 Polydipsia: Secondary | ICD-10-CM

## 2023-06-11 DIAGNOSIS — R002 Palpitations: Secondary | ICD-10-CM

## 2023-06-11 DIAGNOSIS — R350 Frequency of micturition: Secondary | ICD-10-CM

## 2023-06-11 NOTE — Addendum Note (Signed)
Addended by: Thayer Headings, Jonathandavid Marlett L on: 06/11/2023 09:25 AM   Modules accepted: Orders

## 2023-06-12 DIAGNOSIS — M25562 Pain in left knee: Secondary | ICD-10-CM | POA: Diagnosis not present

## 2023-06-12 DIAGNOSIS — R262 Difficulty in walking, not elsewhere classified: Secondary | ICD-10-CM | POA: Diagnosis not present

## 2023-06-12 LAB — COMPREHENSIVE METABOLIC PANEL
ALT: 13 IU/L (ref 0–32)
AST: 24 IU/L (ref 0–40)
Albumin: 4.2 g/dL (ref 3.9–4.9)
Alkaline Phosphatase: 91 IU/L (ref 44–121)
BUN/Creatinine Ratio: 20 (ref 9–23)
BUN: 11 mg/dL (ref 6–24)
Bilirubin Total: 0.4 mg/dL (ref 0.0–1.2)
CO2: 20 mmol/L (ref 20–29)
Calcium: 10 mg/dL (ref 8.7–10.2)
Chloride: 107 mmol/L — ABNORMAL HIGH (ref 96–106)
Creatinine, Ser: 0.54 mg/dL — ABNORMAL LOW (ref 0.57–1.00)
Globulin, Total: 2.5 g/dL (ref 1.5–4.5)
Glucose: 84 mg/dL (ref 70–99)
Potassium: 4.6 mmol/L (ref 3.5–5.2)
Sodium: 142 mmol/L (ref 134–144)
Total Protein: 6.7 g/dL (ref 6.0–8.5)
eGFR: 113 mL/min/{1.73_m2} (ref >59)

## 2023-06-12 LAB — TSH: TSH: 2.71 u[IU]/mL (ref 0.450–4.500)

## 2023-06-12 LAB — CBC
Hematocrit: 40.9 % (ref 34.0–46.6)
Hemoglobin: 13.6 g/dL (ref 11.1–15.9)
MCH: 29.8 pg (ref 26.6–33.0)
MCHC: 33.3 g/dL (ref 31.5–35.7)
MCV: 90 fL (ref 79–97)
Platelets: 256 10*3/uL (ref 150–450)
RBC: 4.56 x10E6/uL (ref 3.77–5.28)
RDW: 12.5 % (ref 11.7–15.4)
WBC: 4.2 10*3/uL (ref 3.4–10.8)

## 2023-06-12 LAB — T4, FREE: Free T4: 1.38 ng/dL (ref 0.82–1.77)

## 2023-06-12 LAB — MAGNESIUM: Magnesium: 2.2 mg/dL (ref 1.6–2.3)

## 2023-06-12 NOTE — Progress Notes (Signed)
Established patient visit   [name]   DOB: 06-Dec-1975   47 y.o. Female  MRN: [id number] Visit Date: 06/07/2023  Today's healthcare provider: Mila Merry, MD   No chief complaint on file.  Subjective    Discussed the use of AI scribe software for clinical note transcription with the patient, who gave verbal consent to proceed.  History of Present Illness   The patient, with a history of bariatric surgery, presented with concerns about her blood sugar levels. Over the past few weeks, she has experienced episodes of erratic heartbeat, anxiety, and queasiness, which she has been able to alleviate with a glass of juice. She also reported excessive thirst, but no issues with urination. She was concerned about a potential UTI and wanted to ensure her blood sugar levels were normal.  Prior to her bariatric surgery, the patient had issues with her blood sugar, but recently she has noticed a rapid drop in her levels. Her diet has remained consistent, consisting of a protein shake and coffee in the morning, a side salad and protein for lunch, and high-protein factor meals for dinner.  The patient also reported a long-term use of esomeprazole . She has been experiencing consistent leg swelling, which she attributes to a musculoskeletal issue. She has not reported any changes in her breathing or any family history of heart disease.       Medications: Outpatient Medications Prior to Visit  Medication Sig   Bioflavonoid Products (BIOFLEX PO) Take 1,500 mg by mouth daily in the afternoon.   esomeprazole (NEXIUM) 40 MG capsule TAKE 1 CAPSULE (40 MG TOTAL) BY MOUTH DAILY AT 12 NOON.   Multiple Vitamin (MULTI-VITAMIN) tablet Take 1 tablet by mouth daily.    [DISCONTINUED] albuterol (VENTOLIN HFA) 108 (90 Base) MCG/ACT inhaler Inhale 2 puffs into the lungs every 6 (six) hours as needed for wheezing or shortness of breath. (Patient not taking: Reported on 06/07/2023)   No  facility-administered medications prior to visit.      Objective    BP 126/72 (BP Location: Left Arm, Patient Position: Sitting, Cuff Size: Large)   Pulse 76   Temp 97.8 F (36.6 C) (Temporal)   Resp 12   Ht 5' 2.5" (1.588 m)   Wt 203 lb 8 oz (92.3 kg)   BMI 36.63 kg/m   Physical Exam   General: Appearance:    Mildly obese female in no acute distress  Eyes:    PERRL, conjunctiva/corneas clear, EOM's intact       Lungs:     Clear to auscultation bilaterally, respirations unlabored  Heart:    Normal heart rate. Normal rhythm. No murmurs, rubs, or gallops.    MS:   All extremities are intact.    Neurologic:   Awake, alert, oriented x 3. No apparent focal neurological defect.           Results for orders placed or performed in visit on 06/07/23  POCT urinalysis dipstick  Result Value Ref Range   Color, UA yellow    Clarity, UA clear    Glucose, UA Negative Negative   Bilirubin, UA Negative    Ketones, UA Negative    Spec Grav, UA 1.020 1.010 - 1.025   Blood, UA Negative    pH, UA 6.0 5.0 - 8.0   Protein, UA Positive (A) Negative   Urobilinogen, UA 0.2 0.2 or 1.0 E.U./dL   Nitrite, UA Negative    Leukocytes, UA Negative Negative  Appearance     Odor      Assessment & Plan        Hypoglycemia Episodes of erratic heartbeat, anxiety, and queasiness relieved by juice intake. History of bariatric surgery. A1c normal. High protein diet. -Consider small, frequent meals to prevent hypoglycemia. -Order metabolic panel to assess for electrolyte imbalances.  Urinary frequency - Normal u/a today   Palpitations Episodes of erratic heartbeat associated with hypoglycemic episodes. No family history of heart disease. No shortness of breath. -Order thyroid panel to rule out thyroid dysfunction as a cause of palpitations.  GERD Long-term use of esomeprazole 40mg  daily. -Continue esomeprazole 40mg  daily.         Mila Merry, MD  [facility] [phone] (phone) [phone] (fax)  [facility]

## 2023-06-14 DIAGNOSIS — R262 Difficulty in walking, not elsewhere classified: Secondary | ICD-10-CM | POA: Diagnosis not present

## 2023-06-14 DIAGNOSIS — M25562 Pain in left knee: Secondary | ICD-10-CM | POA: Diagnosis not present

## 2023-06-19 DIAGNOSIS — R262 Difficulty in walking, not elsewhere classified: Secondary | ICD-10-CM | POA: Diagnosis not present

## 2023-06-19 DIAGNOSIS — M25562 Pain in left knee: Secondary | ICD-10-CM | POA: Diagnosis not present

## 2023-06-20 DIAGNOSIS — M25562 Pain in left knee: Secondary | ICD-10-CM | POA: Diagnosis not present

## 2023-06-20 DIAGNOSIS — R262 Difficulty in walking, not elsewhere classified: Secondary | ICD-10-CM | POA: Diagnosis not present

## 2023-06-24 DIAGNOSIS — M25562 Pain in left knee: Secondary | ICD-10-CM | POA: Diagnosis not present

## 2023-06-24 DIAGNOSIS — R262 Difficulty in walking, not elsewhere classified: Secondary | ICD-10-CM | POA: Diagnosis not present

## 2023-06-26 DIAGNOSIS — R262 Difficulty in walking, not elsewhere classified: Secondary | ICD-10-CM | POA: Diagnosis not present

## 2023-06-26 DIAGNOSIS — M25562 Pain in left knee: Secondary | ICD-10-CM | POA: Diagnosis not present

## 2023-07-03 IMAGING — MG MM DIGITAL SCREENING BILAT W/ TOMO AND CAD
8 series · 8 of 24 positions shown · non-contrast
Comparison: Previous exam(s).

CLINICAL DATA: Screening.

EXAM:
DIGITAL SCREENING BILATERAL MAMMOGRAM WITH TOMOSYNTHESIS AND CAD
TECHNIQUE: Bilateral screening digital craniocaudal and mediolateral oblique
mammograms were obtained. Bilateral screening digital breast
tomosynthesis was performed. The images were evaluated with
computer-aided detection.

[R MLO synth-2D]
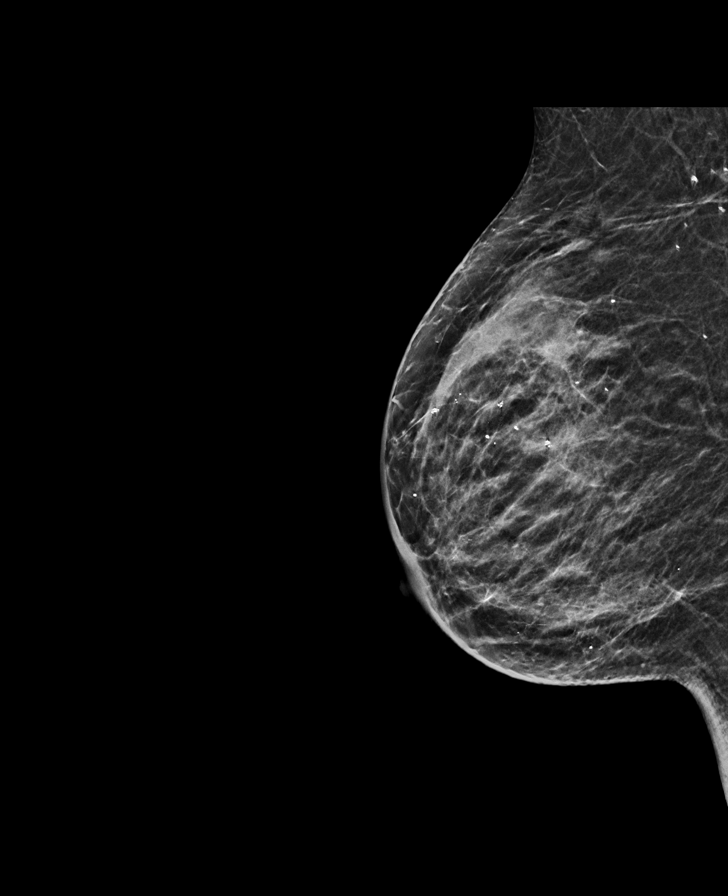

[L CC synth-2D]
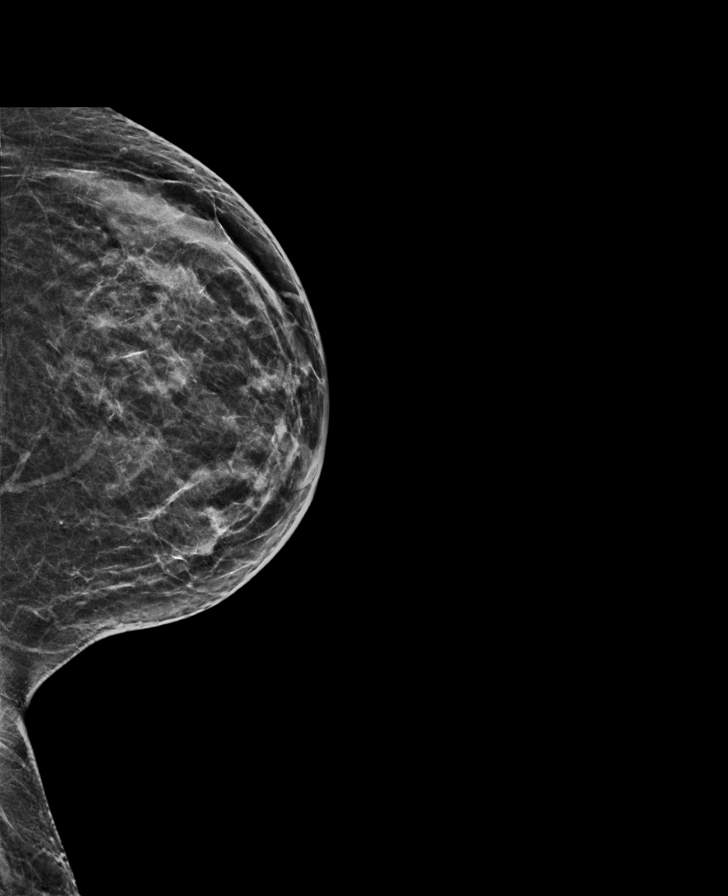

[L MLO synth-2D]
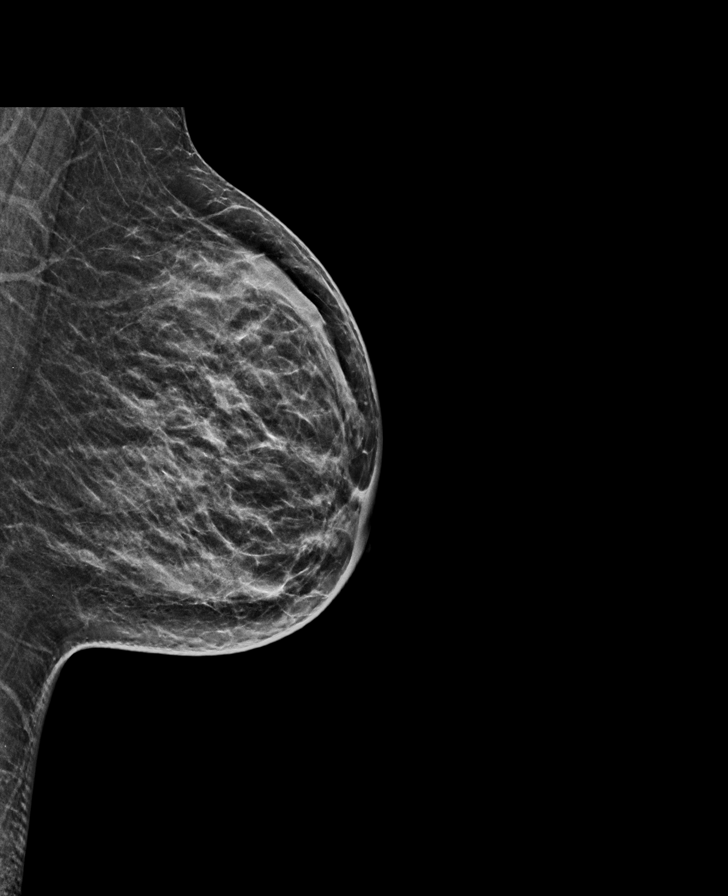

[R CC synth-2D]
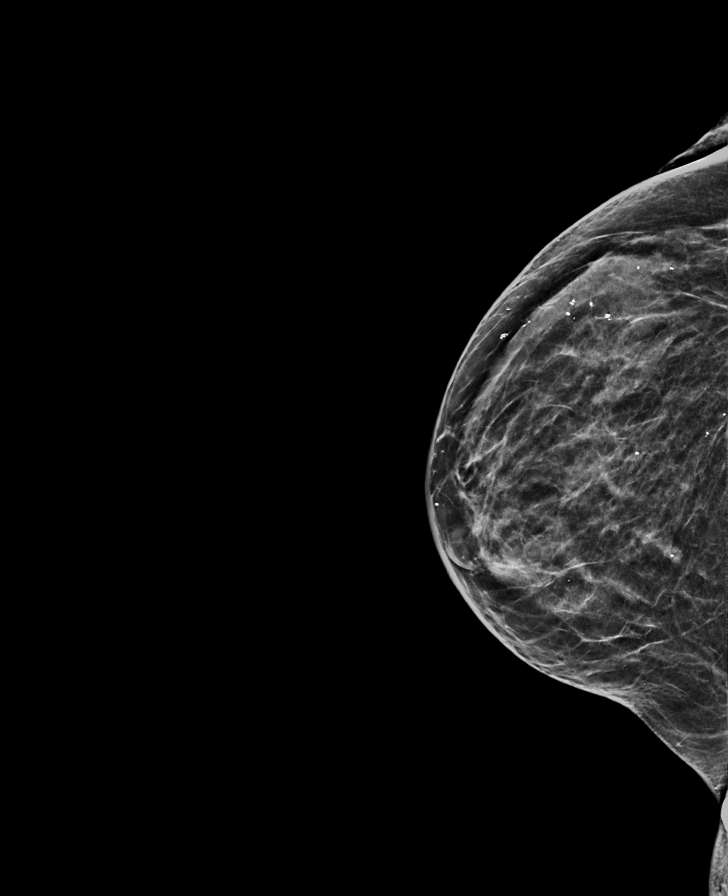

[L MLO tomo · tomo slice 27/53.0]
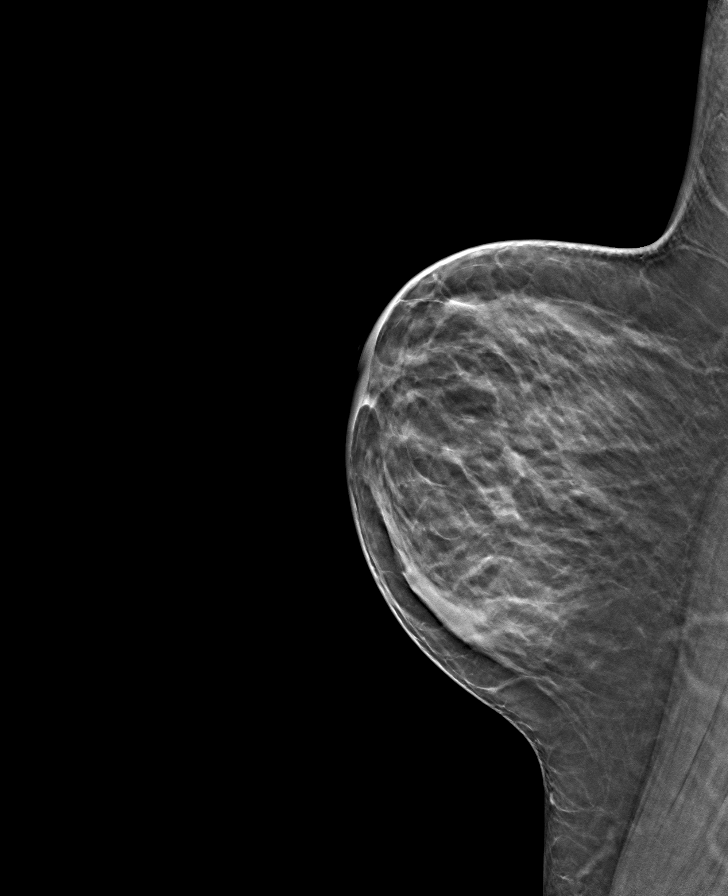

[R MLO tomo · tomo slice 26/51.0]
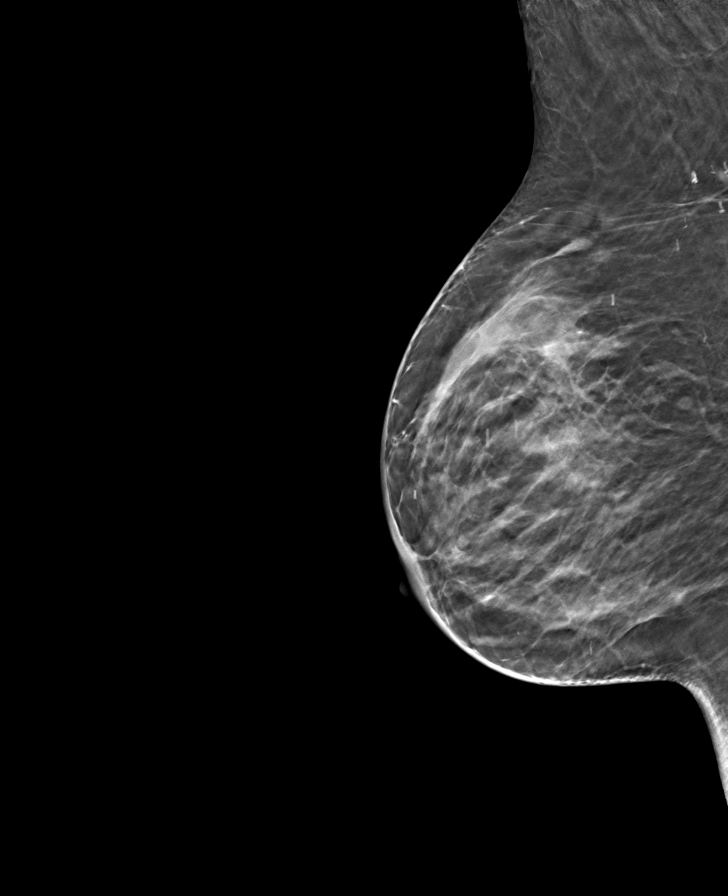

[L CC tomo · tomo slice 29/56.0]
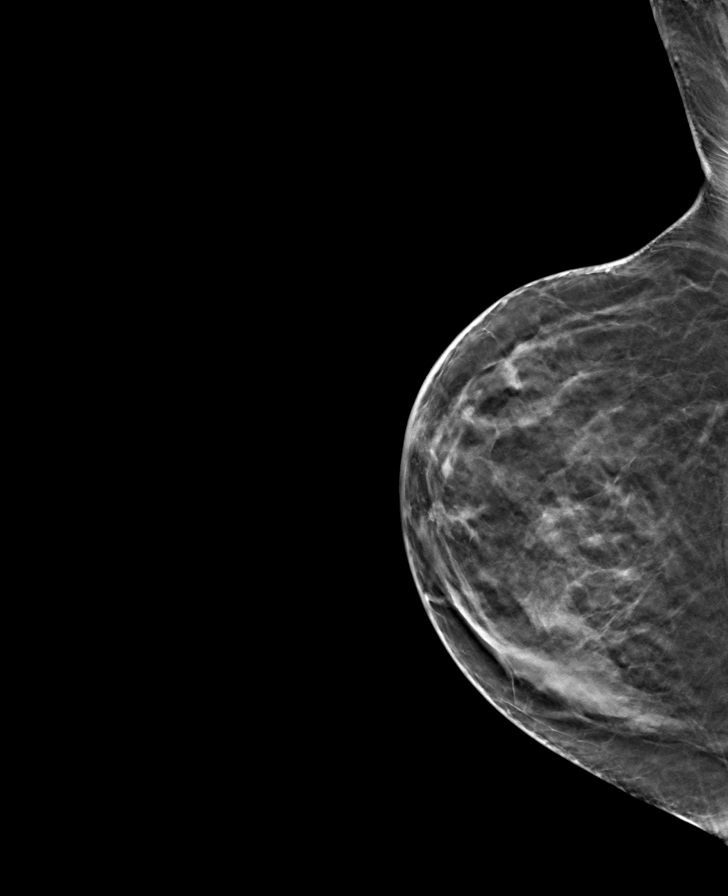

[R CC tomo · tomo slice 27/54.0]
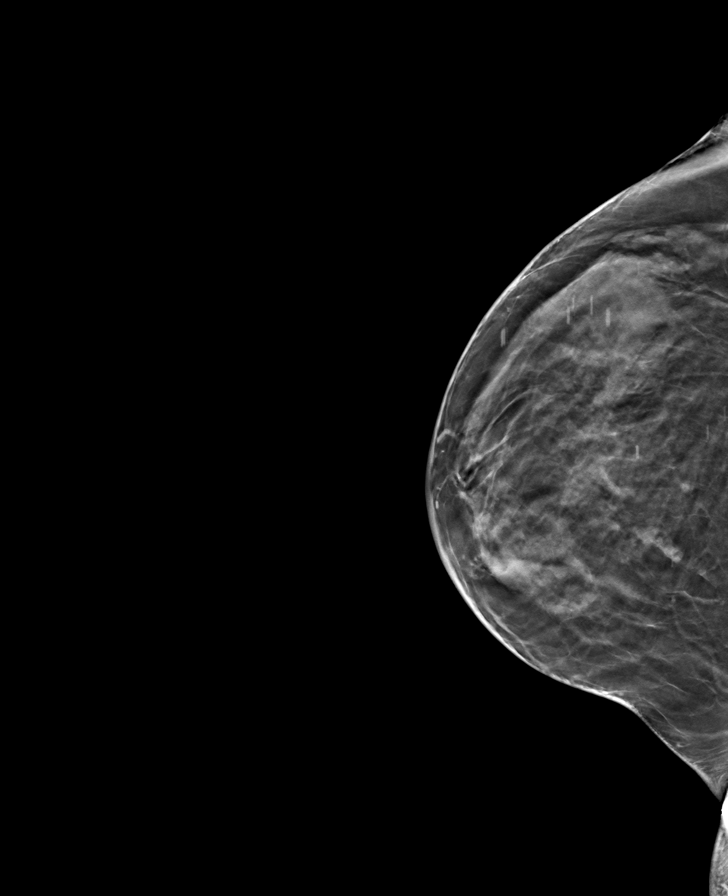

[8 of 24 positions shown; findings below may reference images not displayed]

ACR Breast Density Category c: The breast tissue is heterogeneously
dense, which may obscure small masses.
FINDINGS: In the right breast, calcifications warrant further evaluation with
magnified views. In the left breast, no findings suspicious for
malignancy.
IMPRESSION: Further evaluation is suggested for calcifications in the right
breast.

RECOMMENDATION:
Diagnostic mammogram of the right breast. (Code:00-G-001)

The patient will be contacted regarding the findings, and additional
imaging will be scheduled.

BI-RADS CATEGORY  0: Incomplete. Need additional imaging evaluation
and/or prior mammograms for comparison.
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
## 2023-07-15 ENCOUNTER — Encounter: Payer: Self-pay | Admitting: Obstetrics and Gynecology

## 2023-07-15 ENCOUNTER — Ambulatory Visit (INDEPENDENT_AMBULATORY_CARE_PROVIDER_SITE_OTHER): Payer: BC Managed Care – PPO | Admitting: Obstetrics and Gynecology

## 2023-07-15 ENCOUNTER — Other Ambulatory Visit (HOSPITAL_COMMUNITY)
Admission: RE | Admit: 2023-07-15 | Discharge: 2023-07-15 | Disposition: A | Discharge status: Home / Self Care | Payer: BC Managed Care – PPO | Source: Ambulatory Visit | Attending: Obstetrics and Gynecology | Admitting: Obstetrics and Gynecology

## 2023-07-15 VITALS — BP 126/78 | HR 59 | Ht 62.5 in | Wt 201.0 lb

## 2023-07-15 DIAGNOSIS — Z1151 Encounter for screening for human papillomavirus (HPV): Secondary | ICD-10-CM | POA: Diagnosis not present

## 2023-07-15 DIAGNOSIS — Z1231 Encounter for screening mammogram for malignant neoplasm of breast: Secondary | ICD-10-CM

## 2023-07-15 DIAGNOSIS — Z01419 Encounter for gynecological examination (general) (routine) without abnormal findings: Secondary | ICD-10-CM | POA: Diagnosis not present

## 2023-07-15 DIAGNOSIS — Z124 Encounter for screening for malignant neoplasm of cervix: Secondary | ICD-10-CM | POA: Diagnosis not present

## 2023-07-15 DIAGNOSIS — Z780 Asymptomatic menopausal state: Secondary | ICD-10-CM

## 2023-07-15 DIAGNOSIS — Z1211 Encounter for screening for malignant neoplasm of colon: Secondary | ICD-10-CM

## 2023-07-15 DIAGNOSIS — R87610 Atypical squamous cells of undetermined significance on cytologic smear of cervix (ASC-US): Secondary | ICD-10-CM

## 2023-07-15 NOTE — Patient Instructions (Signed)
I value your feedback and you entrusting us with your care. If you get a [facility] patient survey, I would appreciate you taking the time to let us know about your experience today. Thank you!  [facility] ([place])--[phone]  

## 2023-07-16 LAB — PROGESTERONE: Progesterone: 0.1 ng/mL

## 2023-07-16 LAB — ESTRADIOL: Estradiol: 167 pg/mL

## 2023-07-16 LAB — FSH/LH
FSH: 11.2 m[IU]/mL
LH: 10 m[IU]/mL

## 2023-07-17 DIAGNOSIS — M1732 Unilateral post-traumatic osteoarthritis, left knee: Secondary | ICD-10-CM | POA: Diagnosis not present

## 2023-07-18 LAB — CYTOLOGY - PAP
Comment: NEGATIVE
Diagnosis: NEGATIVE
High risk HPV: NEGATIVE

## 2023-08-09 ENCOUNTER — Encounter: Payer: BC Managed Care – PPO | Admitting: Physician Assistant

## 2023-08-12 ENCOUNTER — Encounter: Payer: BC Managed Care – PPO | Admitting: Family Medicine

## 2023-08-12 DIAGNOSIS — Z0000 Encounter for general adult medical examination without abnormal findings: Secondary | ICD-10-CM | POA: Insufficient documentation
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
## 2023-08-13 ENCOUNTER — Encounter: Payer: Self-pay | Admitting: Physician Assistant

## 2023-08-13 ENCOUNTER — Ambulatory Visit (INDEPENDENT_AMBULATORY_CARE_PROVIDER_SITE_OTHER): Payer: Self-pay | Admitting: Physician Assistant

## 2023-08-13 ENCOUNTER — Other Ambulatory Visit: Payer: Self-pay

## 2023-08-13 VITALS — BP 140/90 | HR 64 | Temp 97.3°F | Ht 62.5 in | Wt 194.0 lb

## 2023-08-13 DIAGNOSIS — R399 Unspecified symptoms and signs involving the genitourinary system: Secondary | ICD-10-CM

## 2023-08-13 LAB — POCT URINALYSIS DIPSTICK
Bilirubin, UA: NEGATIVE
Blood, UA: NEGATIVE
Glucose, UA: NEGATIVE
Ketones, UA: NEGATIVE
Leukocytes, UA: NEGATIVE
Nitrite, UA: NEGATIVE
Protein, UA: POSITIVE — AB
Spec Grav, UA: 1.02 (ref 1.010–1.025)
Urobilinogen, UA: 0.2 U/dL
pH, UA: 6.5 (ref 5.0–8.0)

## 2023-08-13 MED ORDER — NITROFURANTOIN MONOHYD MACRO 100 MG PO CAPS: 100.0000 mg | ORAL_CAPSULE | Freq: Two times a day (BID) | ORAL | 0 refills | Status: AC
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
## 2023-09-09 DIAGNOSIS — Z1211 Encounter for screening for malignant neoplasm of colon: Secondary | ICD-10-CM | POA: Diagnosis not present

## 2023-09-13 LAB — COLOGUARD: COLOGUARD: NEGATIVE

## 2023-10-08 ENCOUNTER — Ambulatory Visit
Admission: RE | Admit: 2023-10-08 | Discharge: 2023-10-08 | Disposition: A | Discharge status: Home / Self Care | Payer: BC Managed Care – PPO | Source: Ambulatory Visit | Attending: Obstetrics and Gynecology | Admitting: Obstetrics and Gynecology

## 2023-10-08 DIAGNOSIS — Z1231 Encounter for screening mammogram for malignant neoplasm of breast: Secondary | ICD-10-CM | POA: Diagnosis not present

## 2023-10-13 ENCOUNTER — Other Ambulatory Visit: Payer: Self-pay | Admitting: Physician Assistant

## 2023-10-13 DIAGNOSIS — K219 Gastro-esophageal reflux disease without esophagitis: Secondary | ICD-10-CM

## 2023-10-14 ENCOUNTER — Other Ambulatory Visit: Payer: Self-pay | Admitting: Physician Assistant

## 2023-10-14 DIAGNOSIS — K219 Gastro-esophageal reflux disease without esophagitis: Secondary | ICD-10-CM

## 2023-10-14 NOTE — Telephone Encounter (Deleted)
 Medication Refill -  Most Recent Primary Care Visit:  Provider: GASPER NANCYANN BRAVO  Department: [facility]  Visit Type: OFFICE VISIT  Date: 06/07/2023  Medication: esomeprazole  (NEXIUM ) 40 MG capsule  Has the patient contacted their pharmacy? Yes **Request refused refill: Patient no longer under prescriber care***********   Is this the correct pharmacy for this prescription? Yes  This is the patient's preferred pharmacy:  [facility] [address], [place] - [address] [place] [address] Phone: [phone] Fax: [phone]   Has the prescription been filled recently? No  Is the patient out of the medication? Yes  Has the patient been seen for an appointment in the last year OR does the patient have an upcoming appointment? Yes  Can we respond through MyChart? Yes  Agent: Please be advised that Rx refills may take up to 3 business days. We ask that you follow-up with your pharmacy.

## 2023-10-14 NOTE — Telephone Encounter (Signed)
 Medication Refill -  Most Recent Primary Care Visit:  Provider: GASPER NANCYANN BRAVO  Department: [facility]  Visit Type: OFFICE VISIT  Date: 06/07/2023  Medication: esomeprazole  (NEXIUM ) 40 MG capsule   Has the patient contacted their pharmacy? Yes Provider is no longer at practice  Is this the correct pharmacy for this prescription? Yes   This is the patient's preferred pharmacy:  [facility] [address] [place] - [address] [place] [address] Phone: [phone] Fax: [phone]   Has the prescription been filled recently? Yes  Is the patient out of the medication? Yes  Has the patient been seen for an appointment in the last year OR does the patient have an upcoming appointment? Yes  Can we respond through MyChart? Yes  Agent: Please be advised that Rx refills may take up to 3 business days. We ask that you follow-up with your pharmacy.

## 2023-10-16 MED ORDER — ESOMEPRAZOLE MAGNESIUM 40 MG PO CPDR: 40.0000 mg | DELAYED_RELEASE_CAPSULE | Freq: Every day | ORAL | 0 refills | Status: DC

## 2023-10-16 NOTE — Telephone Encounter (Signed)
 Call to patient- she has been scheduled for transfer of care to provider in office. Patient was seen by Dr Gasper in August and notes to continue esomeprazole  40mg - will give courtesy RF 30 day. Requested Prescriptions  Pending Prescriptions Disp Refills   esomeprazole  (NEXIUM ) 40 MG capsule 90 capsule 3    Sig: Take 1 capsule (40 mg total) by mouth daily at 12 noon.     Gastroenterology: Proton Pump Inhibitors 2 Passed - 10/16/2023  2:42 PM      Passed - ALT in normal range and within 360 days    ALT  Date Value Ref Range Status  06/11/2023 13 0 - 32 IU/L Final         Passed - AST in normal range and within 360 days    AST  Date Value Ref Range Status  06/11/2023 24 0 - 40 IU/L Final         Passed - Valid encounter within last 12 months    Recent Outpatient Visits           4 months ago Urinary frequency   [facility] Gasper Nancyann BRAVO, MD   1 year ago Annual physical exam   [facility] Cyndi Shaver, PA-C   2 years ago Gastroesophageal reflux disease without esophagitis   [facility] Cyndi Shaver, PA-C   3 years ago SBO (small bowel obstruction) [facility])   [facility] [place], [place] HERO, [place]   4 years ago Annual physical exam   [facility] [place], [place] HER[place], [place]       Future Appointments             In 2 weeks Simmons-Robinson, Rockie, MD [facility]

## 2023-11-04 ENCOUNTER — Ambulatory Visit (INDEPENDENT_AMBULATORY_CARE_PROVIDER_SITE_OTHER): Payer: BC Managed Care – PPO | Admitting: Family Medicine

## 2023-11-04 ENCOUNTER — Encounter: Payer: Self-pay | Admitting: Family Medicine

## 2023-11-04 VITALS — BP 155/80 | HR 61 | Ht 62.0 in | Wt 203.9 lb

## 2023-11-04 DIAGNOSIS — Z131 Encounter for screening for diabetes mellitus: Secondary | ICD-10-CM

## 2023-11-04 DIAGNOSIS — R030 Elevated blood-pressure reading, without diagnosis of hypertension: Secondary | ICD-10-CM | POA: Diagnosis not present

## 2023-11-04 DIAGNOSIS — D649 Anemia, unspecified: Secondary | ICD-10-CM | POA: Diagnosis not present

## 2023-11-04 DIAGNOSIS — E559 Vitamin D deficiency, unspecified: Secondary | ICD-10-CM | POA: Diagnosis not present

## 2023-11-04 DIAGNOSIS — N951 Menopausal and female climacteric states: Secondary | ICD-10-CM | POA: Insufficient documentation

## 2023-11-04 DIAGNOSIS — Z0000 Encounter for general adult medical examination without abnormal findings: Secondary | ICD-10-CM

## 2023-11-04 DIAGNOSIS — E538 Deficiency of other specified B group vitamins: Secondary | ICD-10-CM

## 2023-11-04 DIAGNOSIS — K219 Gastro-esophageal reflux disease without esophagitis: Secondary | ICD-10-CM

## 2023-11-04 MED ORDER — ESOMEPRAZOLE MAGNESIUM 40 MG PO CPDR: 40.0000 mg | DELAYED_RELEASE_CAPSULE | Freq: Every day | ORAL | 1 refills | Status: DC

## 2023-11-04 NOTE — Assessment & Plan Note (Signed)
Recent weight gain despite high-protein, low-carb diet. Limited exercise due to metal implants in legs. Continue current diet and encourage moderate walking as tolerated. - Continue current diet - Encourage moderate walking as tolerated

## 2023-11-04 NOTE — Assessment & Plan Note (Signed)
Vitamin B12 deficiency. Plan to check vitamin B12 levels. - Order vitamin B12 level
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
## 2023-11-04 NOTE — Assessment & Plan Note (Signed)
Anemia. Plan to check CBC. - Order CBC

## 2023-11-04 NOTE — Assessment & Plan Note (Addendum)
Chronic conditions are stable  Patient was counseled on benefits of regular physical activity with goal of 150 minutes of moderate to vigurous intensity 4 days per week  Patient was counseled to consume well balanced diet of fruits, vegetables, limited saturated fats and limited sugary foods and beverages with emphasis on consuming 6-8 glasses of water daily  Screening recommended today: A1c, lipids,CMP,CBC,B12,VITA D  Vaccines recommended today: pt is up to date on vaccines

## 2023-11-04 NOTE — Assessment & Plan Note (Signed)
Elevated blood pressure readings (150/80 and 160/80). Recent episode of hot flash with heart palpitations may be contributing. Monitoring at home for one month to determine need for medication. - Monitor blood pressure at home for one month - Follow-up in one month to reassess blood pressure

## 2023-11-04 NOTE — Assessment & Plan Note (Signed)
Recent hot flash with sweating and heart palpitations. History of night sweats. Symptoms suggestive of perimenopausal changes. Potential use of Veozah if liver function tests are normal. - Check liver function tests - Provide one-week sample of Veozah if liver function tests are normal

## 2023-11-04 NOTE — Assessment & Plan Note (Signed)
Low vitamin D levels. Plan to check vitamin D levels. - Order vitamin D level

## 2023-11-05 DIAGNOSIS — E538 Deficiency of other specified B group vitamins: Secondary | ICD-10-CM | POA: Diagnosis not present

## 2023-11-05 DIAGNOSIS — Z131 Encounter for screening for diabetes mellitus: Secondary | ICD-10-CM | POA: Diagnosis not present

## 2023-11-05 DIAGNOSIS — R030 Elevated blood-pressure reading, without diagnosis of hypertension: Secondary | ICD-10-CM | POA: Diagnosis not present

## 2023-11-05 DIAGNOSIS — K219 Gastro-esophageal reflux disease without esophagitis: Secondary | ICD-10-CM | POA: Diagnosis not present

## 2023-11-05 DIAGNOSIS — E559 Vitamin D deficiency, unspecified: Secondary | ICD-10-CM | POA: Diagnosis not present

## 2023-11-05 DIAGNOSIS — D649 Anemia, unspecified: Secondary | ICD-10-CM | POA: Diagnosis not present

## 2023-11-06 ENCOUNTER — Encounter: Payer: Self-pay | Admitting: Family Medicine

## 2023-11-06 LAB — VITAMIN B12: Vitamin B-12: 1603 pg/mL — ABNORMAL HIGH (ref 232–1245)

## 2023-11-06 LAB — TSH+T4F+T3FREE
Free T4: 1.3 ng/dL (ref 0.82–1.77)
T3, Free: 2.3 pg/mL (ref 2.0–4.4)
TSH: 1.96 u[IU]/mL (ref 0.450–4.500)

## 2023-11-06 LAB — CBC
Hematocrit: 40.5 % (ref 34.0–46.6)
Hemoglobin: 13.4 g/dL (ref 11.1–15.9)
MCH: 29.5 pg (ref 26.6–33.0)
MCHC: 33.1 g/dL (ref 31.5–35.7)
MCV: 89 fL (ref 79–97)
Platelets: 239 10*3/uL (ref 150–450)
RBC: 4.54 x10E6/uL (ref 3.77–5.28)
RDW: 12.4 % (ref 11.7–15.4)
WBC: 3.8 10*3/uL (ref 3.4–10.8)

## 2023-11-06 LAB — CMP14+EGFR
ALT: 15 [IU]/L (ref 0–32)
AST: 20 [IU]/L (ref 0–40)
Albumin: 3.9 g/dL (ref 3.9–4.9)
Alkaline Phosphatase: 83 [IU]/L (ref 44–121)
BUN/Creatinine Ratio: 21 (ref 9–23)
BUN: 11 mg/dL (ref 6–24)
Bilirubin Total: 0.3 mg/dL (ref 0.0–1.2)
CO2: 26 mmol/L (ref 20–29)
Calcium: 10 mg/dL (ref 8.7–10.2)
Chloride: 104 mmol/L (ref 96–106)
Creatinine, Ser: 0.52 mg/dL — ABNORMAL LOW (ref 0.57–1.00)
Globulin, Total: 2.2 g/dL (ref 1.5–4.5)
Glucose: 88 mg/dL (ref 70–99)
Potassium: 4.3 mmol/L (ref 3.5–5.2)
Sodium: 141 mmol/L (ref 134–144)
Total Protein: 6.1 g/dL (ref 6.0–8.5)
eGFR: 114 mL/min/{1.73_m2} (ref >59)

## 2023-11-06 LAB — HEMOGLOBIN A1C
Est. average glucose Bld gHb Est-mCnc: 88 mg/dL
Hgb A1c MFr Bld: 4.7 % — ABNORMAL LOW (ref 4.8–5.6)

## 2023-11-06 LAB — VITAMIN D 25 HYDROXY (VIT D DEFICIENCY, FRACTURES): Vit D, 25-Hydroxy: 50.2 ng/mL (ref 30.0–100.0)

## 2023-11-11 ENCOUNTER — Encounter: Payer: Self-pay | Admitting: Family Medicine

## 2023-11-11 ENCOUNTER — Ambulatory Visit: Payer: BC Managed Care – PPO | Admitting: Family Medicine

## 2023-11-11 VITALS — BP 170/80 | HR 69 | Resp 16 | Wt 195.0 lb

## 2023-11-11 DIAGNOSIS — R030 Elevated blood-pressure reading, without diagnosis of hypertension: Secondary | ICD-10-CM | POA: Diagnosis not present

## 2023-11-11 DIAGNOSIS — I10 Essential (primary) hypertension: Secondary | ICD-10-CM

## 2023-11-11 MED ORDER — VALSARTAN 80 MG PO TABS: 80.0000 mg | ORAL_TABLET | Freq: Three times a day (TID) | ORAL | 1 refills | Status: DC | PRN

## 2023-11-11 MED ORDER — ALPRAZOLAM 0.25 MG PO TABS: 0.2500 mg | ORAL_TABLET | Freq: Two times a day (BID) | ORAL | 0 refills | Status: DC | PRN

## 2023-11-11 NOTE — Patient Instructions (Addendum)
Please review the attached list of medications and notify my office if there are any errors.   Return to the lab to check on your potassium levels in the week of February 17th

## 2023-11-11 NOTE — Progress Notes (Signed)
Established patient visit   [name]   DOB: 29-Jul-1976   48 y.o. Female  MRN: [id number] Visit Date: 11/11/2023  Today's healthcare provider: Mila Merry, MD   Chief Complaint  Patient presents with   Medical Management of Chronic Issues    Elevated BP.  Symptoms: Neck stiffness, fullness in ears, tingling in her jaw, muscle skeletal issues on left arm.   Subjective    Discussed the use of AI scribe software for clinical note transcription with the patient, who gave verbal consent to proceed.  History of Present Illness   The patient is a 48 year old female with who presents with anxiety and a panic attack. She experiences significant anxiety related to her high blood pressure, which she believes led to her first panic attack this past week. Symptoms include tachycardia and feeling 'freaked out' with every symptom, contributing to her anxiety. She monitors her blood pressure, noting significant increases with movement, and has recorded a diastolic pressure as high as 119 mmHg while at rest. She experiences headaches upon waking, initially attributed to her pillow, but now suspects they may be related to her blood pressure. Additionally, she reports a stiff neck, tingling in her jaw, and a sensation of fullness in her ear when her blood pressure is high.  She has a family history of both high blood pressure and anxiety. 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  She is a bariatric patient and tracks her food intake, maintaining her sodium intake within limits most days. She does not smoke and drinks wine in moderation. Due to metal in her legs, she is unable to exercise as she would like.       Medications: Outpatient Medications Prior to Visit  Medication Sig   esomeprazole (NEXIUM) 40 MG capsule Take 1 capsule (40  mg total) by mouth daily at 12 noon.   FLUCELVAX 0.5 ML injection Inject 0.5 mLs into the muscle once.   Multiple Vitamin (MULTI-VITAMIN) tablet Take 1 tablet by mouth daily.    SPIKEVAX syringe Inject 0.5 mLs into the muscle once.   Vitamin D-Vitamin K (VITAMIN K2-VITAMIN D3 PO) Take by mouth. 5000  100 MCG   No facility-administered medications prior to visit.   Review of Systems     Objective    BP (!) 170/80 (BP Location: Right Arm, Patient Position: Sitting, Cuff Size: Large)   Pulse 69   Resp 16   Wt 195 lb (88.5 kg)   SpO2 100%   BMI 35.67 kg/m   Physical Exam  General appearance: Mildly obese female, cooperative and in no acute distress Head: Normocephalic, without obvious abnormality, atraumatic Respiratory: Respirations even and unlabored, normal respiratory rate Extremities: All extremities are intact.  Skin: Skin color, texture, turgor normal. No rashes seen  Psych: Appropriate mood and affect. Neurologic: Mental status: Alert, oriented to person, place, and time, thought content appropriate.   Assessment & Plan       Hypertension Newly diagnosed with reported symptoms of headache and neck stiffness. Family history of hypertension. EKG shows LVH. -Start Valsartan 80mg  once a day -Check potassium levels in 2-3 weeks after starting Valsartan.  Anxiety Reports of panic attacks and anxiety related to hypertension diagnosis. -As needed use of Alprazolam for panic attacks.  -Check cholesterol levels today.  Follow-up -Return on 11/25/2023 for potassium check. -  Follow-up appointment with Dr. Roxan Hockey on 12/05/2023 to assess blood pressure control.         Mila Merry, MD  [facility] [phone] (phone) [phone] (fax)  [facility]

## 2023-11-12 ENCOUNTER — Other Ambulatory Visit: Payer: Self-pay | Admitting: Family Medicine

## 2023-11-12 ENCOUNTER — Encounter: Payer: Self-pay | Admitting: Family Medicine

## 2023-11-12 DIAGNOSIS — I10 Essential (primary) hypertension: Secondary | ICD-10-CM

## 2023-11-12 LAB — LIPID PANEL
Chol/HDL Ratio: 2 {ratio} (ref 0.0–4.4)
Cholesterol, Total: 210 mg/dL — ABNORMAL HIGH (ref 100–199)
HDL: 106 mg/dL (ref >39)
LDL Chol Calc (NIH): 87 mg/dL (ref 0–99)
Triglycerides: 101 mg/dL (ref 0–149)
VLDL Cholesterol Cal: 17 mg/dL (ref 5–40)

## 2023-11-12 MED ORDER — VALSARTAN 80 MG PO TABS: 80.0000 mg | ORAL_TABLET | Freq: Every day | ORAL | Status: DC

## 2023-11-18 DIAGNOSIS — M7542 Impingement syndrome of left shoulder: Secondary | ICD-10-CM | POA: Diagnosis not present

## 2023-11-25 DIAGNOSIS — I10 Essential (primary) hypertension: Secondary | ICD-10-CM | POA: Diagnosis not present

## 2023-11-26 LAB — RENAL FUNCTION PANEL
Albumin: 4.2 g/dL (ref 3.9–4.9)
BUN/Creatinine Ratio: 21 (ref 9–23)
BUN: 12 mg/dL (ref 6–24)
CO2: 24 mmol/L (ref 20–29)
Calcium: 9.9 mg/dL (ref 8.7–10.2)
Chloride: 104 mmol/L (ref 96–106)
Creatinine, Ser: 0.58 mg/dL (ref 0.57–1.00)
Glucose: 88 mg/dL (ref 70–99)
Phosphorus: 3.1 mg/dL (ref 3.0–4.3)
Potassium: 4.4 mmol/L (ref 3.5–5.2)
Sodium: 141 mmol/L (ref 134–144)
eGFR: 111 mL/min/{1.73_m2} (ref >59)

## 2023-11-28 ENCOUNTER — Encounter: Payer: Self-pay | Admitting: Family Medicine

## 2023-12-02 DIAGNOSIS — M5412 Radiculopathy, cervical region: Secondary | ICD-10-CM | POA: Diagnosis not present
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
## 2023-12-05 ENCOUNTER — Ambulatory Visit (INDEPENDENT_AMBULATORY_CARE_PROVIDER_SITE_OTHER): Payer: BC Managed Care – PPO | Admitting: Family Medicine

## 2023-12-05 ENCOUNTER — Encounter: Payer: Self-pay | Admitting: Family Medicine

## 2023-12-05 VITALS — BP 124/75 | HR 66 | Ht 62.0 in | Wt 196.0 lb

## 2023-12-05 DIAGNOSIS — N951 Menopausal and female climacteric states: Secondary | ICD-10-CM

## 2023-12-05 DIAGNOSIS — E282 Polycystic ovarian syndrome: Secondary | ICD-10-CM

## 2023-12-05 DIAGNOSIS — F419 Anxiety disorder, unspecified: Secondary | ICD-10-CM | POA: Diagnosis not present

## 2023-12-05 DIAGNOSIS — I10 Essential (primary) hypertension: Secondary | ICD-10-CM

## 2023-12-05 DIAGNOSIS — E041 Nontoxic single thyroid nodule: Secondary | ICD-10-CM

## 2023-12-05 DIAGNOSIS — Z6829 Body mass index (BMI) 29.0-29.9, adult: Secondary | ICD-10-CM

## 2023-12-05 DIAGNOSIS — R030 Elevated blood-pressure reading, without diagnosis of hypertension: Secondary | ICD-10-CM

## 2023-12-05 DIAGNOSIS — E538 Deficiency of other specified B group vitamins: Secondary | ICD-10-CM

## 2023-12-05 DIAGNOSIS — Z8709 Personal history of other diseases of the respiratory system: Secondary | ICD-10-CM

## 2023-12-05 DIAGNOSIS — K219 Gastro-esophageal reflux disease without esophagitis: Secondary | ICD-10-CM

## 2023-12-05 DIAGNOSIS — R350 Frequency of micturition: Secondary | ICD-10-CM

## 2023-12-05 DIAGNOSIS — E559 Vitamin D deficiency, unspecified: Secondary | ICD-10-CM

## 2023-12-05 MED ORDER — BUSPIRONE HCL 5 MG PO TABS: 5.0000 mg | ORAL_TABLET | Freq: Two times a day (BID) | ORAL | 2 refills | Status: DC

## 2023-12-05 NOTE — Assessment & Plan Note (Signed)
 New onset anxiety with symptoms of fight or flight response, feelings of doom, and irritability. Previous trial of Xanax was ineffective. Current anxiety score is 4, up from 0 last month. Potential hormonal influence from menopause and gabapentin for shoulder pain may contribute to anxiety. Discussed options for daily medications including Lexapro, Zoloft, and Buspar. Buspar preferred due to lower dependence potential and positive patient outcomes. Recent onset, stable, intermittent  - Prescribe Buspar 5 mg twice daily as needed - Monitor for side effects, especially in combination with gabapentin - Avoid alcohol with initial doses of Buspar - Schedule virtual follow-up in 3 weeks

## 2023-12-05 NOTE — Assessment & Plan Note (Addendum)
 Chronic hypertension, well controlled with valsartan 80 mg daily. Clinic blood pressure readings are within goal range (124/75). Patient reports higher readings at home, potentially due to not sitting still long enough before taking measurements. Discussed potential for discontinuing valsartan if blood pressure remains stable with lifestyle changes such as weight loss, diet, and exercise. - Continue valsartan 80 mg daily - Monitor blood pressure at home ensuring proper technique (sitting still for 5 minutes before measurement)

## 2023-12-09 DIAGNOSIS — M5412 Radiculopathy, cervical region: Secondary | ICD-10-CM | POA: Diagnosis not present

## 2023-12-10 DIAGNOSIS — M5412 Radiculopathy, cervical region: Secondary | ICD-10-CM | POA: Diagnosis not present

## 2023-12-12 ENCOUNTER — Other Ambulatory Visit: Payer: Self-pay | Admitting: Family Medicine

## 2023-12-12 DIAGNOSIS — F419 Anxiety disorder, unspecified: Secondary | ICD-10-CM

## 2023-12-18 DIAGNOSIS — M5412 Radiculopathy, cervical region: Secondary | ICD-10-CM | POA: Diagnosis not present

## 2023-12-25 DIAGNOSIS — M5412 Radiculopathy, cervical region: Secondary | ICD-10-CM | POA: Diagnosis not present

## 2024-01-06 NOTE — Progress Notes (Unsigned)
 MyChart Video Visit    Virtual Visit via Video Note   This format is felt to be most appropriate for this patient at this time. Physical exam was limited by quality of the video and audio technology used for the visit.   Patient location: Patient's home address   Provider location: [facility]  [address], [address]  [place], [place] [address]   I discussed the limitations of evaluation and management by telemedicine and the availability of in person appointments. The patient expressed understanding and agreed to proceed.  [name]   DOB: 07-15-1976   47 y.o. Female  MRN: [id number] Visit Date: 01/07/2024  Today's healthcare provider: Ronnald Ramp, MD   No chief complaint on file.  Subjective    HPI   Discussed the use of AI scribe software for clinical note transcription with the patient, who gave verbal consent to proceed.  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   gabapentin (NEURONTIN) 100 MG capsule Take by mouth.   methocarbamol (ROBAXIN) 500 MG tablet Take 500 mg by mouth 2 (two) times daily as needed.   Multiple Vitamin (MULTI-VITAMIN) tablet Take 1 tablet by mouth daily.    valsartan (DIOVAN) 80 MG tablet Take 1 tablet (80 mg total) by mouth daily.   Vitamin D-Vitamin K (VITAMIN K2-VITAMIN D3 PO) Take by mouth. 5000  100 MCG   No facility-administered medications prior to visit.    Review of Systems  {Insert previous labs (optional):23779} {See past labs  Heme  Chem  Endocrine  Serology  Results Review (optional):1}   Objective    There were no vitals taken for this visit.  {Insert last BP/Wt (optional):23777}{See vitals history (optional):1}    Physical Exam     Assessment & Plan     Problem List Items Addressed This Visit   None   Assessment and Plan Assessment & Plan      No follow-ups on file.     I discussed the assessment and treatment plan with the patient. The patient was provided an opportunity to ask questions and all were answered. The patient agreed with the plan and demonstrated an understanding of the instructions.   The patient was advised to call back or seek an in-person evaluation if the symptoms worsen or if the condition fails to improve as anticipated.  I provided ***  minutes of non-face-to-face time during this encounter.   Ronnald Ramp, MD [facility] [phone] (phone) [phone] (fax)  [facility]

## 2024-01-07 ENCOUNTER — Telehealth (INDEPENDENT_AMBULATORY_CARE_PROVIDER_SITE_OTHER): Payer: BC Managed Care – PPO | Admitting: Family Medicine

## 2024-01-07 ENCOUNTER — Encounter: Payer: Self-pay | Admitting: Family Medicine

## 2024-01-07 VITALS — BP 110/70

## 2024-01-07 DIAGNOSIS — I10 Essential (primary) hypertension: Secondary | ICD-10-CM

## 2024-01-07 DIAGNOSIS — F419 Anxiety disorder, unspecified: Secondary | ICD-10-CM

## 2024-01-07 MED ORDER — VALSARTAN 80 MG PO TABS
80.0000 mg | ORAL_TABLET | Freq: Every day | ORAL | 1 refills | Status: DC
Start: 2024-01-07 — End: 2024-06-30

## 2024-01-07 NOTE — Assessment & Plan Note (Signed)
 Anxiety Chronic anxiety, managed with Buspar 5 mg twice daily. She reports a positive response to the medication, feeling more in control despite one brief panic attack since the last appointment. The current dosage is effective, with potential for future increase if necessary. - Continue Buspar 5 mg twice daily - Follow up in October or sooner if needed

## 2024-01-07 NOTE — Assessment & Plan Note (Signed)
 Hypertension Chronic hypertension, well-controlled with valsartan 80 mg daily. Recent blood pressure readings are within target range (e.g., 127/79 mmHg and 110/70 mmHg). A prescription label error indicating three times daily dosing has been corrected. She requires a refill soon, and six months' worth of refills have been sent. - Continue valsartan 80 mg daily - Send six months' worth of valsartan refills

## 2024-01-07 NOTE — Patient Instructions (Signed)
 VISIT SUMMARY:  During your follow-up appointment, we discussed your current medications and their effectiveness. You reported that Buspar is helping with your anxiety, although you had one brief panic attack. Your blood pressure is well-controlled with valsartan. I have updated the instructions for 80-mg once daily. Great job on the blood pressure management!  You also mentioned that gabapentin is not helping with your shoulder nerve pain, and you plan to discuss discontinuing it with a specialist.  YOUR PLAN:  -ANXIETY: Anxiety is a condition characterized by feelings of worry and fear. You are taking Buspar 5 mg twice daily, which has been effective in managing your symptoms. You experienced one brief panic attack but felt more in control. Continue with the current dosage and follow up in October or sooner if needed.  -HYPERTENSION: Hypertension, or high blood pressure, is a condition where the force of the blood against your artery walls is too high. Your blood pressure is well-controlled with valsartan 80 mg daily. A labeling error on your medication bottle has been corrected. Continue taking valsartan as prescribed, and six months' worth of refills have been sent.  INSTRUCTIONS:  1. Consult with the spine specialist on January 13, 2024, regarding your shoulder pain and the possibility of discontinuing gabapentin. 2. Continue taking Buspar 5 mg twice daily for anxiety and follow up in October or sooner if needed. 3. Continue taking valsartan 80 mg daily for hypertension, and use the six months' worth of refills that have been sent.

## 2024-01-08 DIAGNOSIS — M5412 Radiculopathy, cervical region: Secondary | ICD-10-CM | POA: Diagnosis not present

## 2024-01-13 DIAGNOSIS — M25712 Osteophyte, left shoulder: Secondary | ICD-10-CM | POA: Diagnosis not present

## 2024-01-13 DIAGNOSIS — G44209 Tension-type headache, unspecified, not intractable: Secondary | ICD-10-CM | POA: Diagnosis not present

## 2024-01-13 DIAGNOSIS — I10 Essential (primary) hypertension: Secondary | ICD-10-CM | POA: Diagnosis not present

## 2024-01-13 DIAGNOSIS — M4722 Other spondylosis with radiculopathy, cervical region: Secondary | ICD-10-CM | POA: Diagnosis not present

## 2024-01-13 DIAGNOSIS — M25512 Pain in left shoulder: Secondary | ICD-10-CM | POA: Diagnosis not present

## 2024-01-13 DIAGNOSIS — M5412 Radiculopathy, cervical region: Secondary | ICD-10-CM | POA: Diagnosis not present

## 2024-01-13 DIAGNOSIS — Z885 Allergy status to narcotic agent status: Secondary | ICD-10-CM | POA: Diagnosis not present

## 2024-01-13 DIAGNOSIS — M7912 Myalgia of auxiliary muscles, head and neck: Secondary | ICD-10-CM | POA: Diagnosis not present

## 2024-01-29 DIAGNOSIS — M5412 Radiculopathy, cervical region: Secondary | ICD-10-CM | POA: Diagnosis not present

## 2024-02-17 DIAGNOSIS — M47812 Spondylosis without myelopathy or radiculopathy, cervical region: Secondary | ICD-10-CM | POA: Diagnosis not present

## 2024-02-17 DIAGNOSIS — M542 Cervicalgia: Secondary | ICD-10-CM | POA: Diagnosis not present

## 2024-03-04 ENCOUNTER — Other Ambulatory Visit: Payer: Self-pay

## 2024-03-04 ENCOUNTER — Ambulatory Visit: Payer: Self-pay | Admitting: Adult Health

## 2024-03-04 VITALS — BP 139/68 | HR 60 | Temp 98.3°F | Ht 62.5 in | Wt 192.0 lb

## 2024-03-04 DIAGNOSIS — J358 Other chronic diseases of tonsils and adenoids: Secondary | ICD-10-CM

## 2024-03-04 NOTE — Progress Notes (Signed)
 [facility] [address] [place], [place] [address]   Office Visit Note  Patient Name: Mariah Martin Date of Birth [id number]  Medical Record number [id number]  Date of Service: 03/04/2024  Chief Complaint  Patient presents with   tonsil stones    Patient states she woke up at 3 AM with a sore throat. She noticed large white spots on her tonsils.       Sore Throat  Associated symptoms include headaches. Pertinent negatives include no congestion or coughing.   Pt is here for a sick visit. She reports waking up this morning at 3am reporting left side throat pain.  When she looked in the mirror she noticed some white spots.  Denies fever, chills, cough.  She did have a headache yesterday.   Current Medication:  Outpatient Encounter Medications as of 03/04/2024  Medication Sig   busPIRone  (BUSPAR ) 5 MG tablet TAKE 1 TABLET BY MOUTH TWICE A DAY   esomeprazole  (NEXIUM ) 40 MG capsule Take 1 capsule (40 mg total) by mouth daily at 12 noon.   Multiple Vitamin (MULTI-VITAMIN) tablet Take 1 tablet by mouth daily.    valsartan  (DIOVAN ) 80 MG tablet Take 1 tablet (80 mg total) by mouth daily.   Vitamin D -Vitamin K (VITAMIN K2-VITAMIN D3 PO) Take by mouth. 5000  100 MCG   No facility-administered encounter medications on file as of 03/04/2024.      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  HENT:  Positive for postnasal drip. Negative for congestion, rhinorrhea and sinus pain.   Eyes:  Negative for pain and itching.  Respiratory:  Negative for cough.   Neurological:  Positive for headaches.    Physical Exam HENT:     Mouth/Throat:     Lips: Pink.     Mouth: Mucous membranes are moist.     Tongue: No lesions.     Pharynx: No pharyngeal swelling or posterior oropharyngeal erythema.      Comments: Tonsil stone left side   Assessment/Plan: 1. Tonsil stone (Primary) Discussed cause of tonsil stones. Patient can gargle with salt water OR one part Hydrogen peroxide, and two parts water, twice daily as needed.    General Counseling: Mariah Martin verbalizes understanding of the findings of todays visit and agrees with plan of treatment. I have discussed any further diagnostic evaluation that may be  needed or ordered today. We also reviewed her medications today. she has been encouraged to call the office with any questions or concerns that should arise related to todays visit.   No orders of the defined types were placed in this encounter.   No orders of the defined types were placed in this encounter.   Time spent:15 Minutes    Sheria Dills AGNP-C Nurse Practitioner

## 2024-03-16 DIAGNOSIS — M5412 Radiculopathy, cervical region: Secondary | ICD-10-CM | POA: Diagnosis not present

## 2024-03-31 ENCOUNTER — Ambulatory Visit (INDEPENDENT_AMBULATORY_CARE_PROVIDER_SITE_OTHER): Payer: Self-pay | Admitting: Oncology

## 2024-03-31 ENCOUNTER — Encounter: Payer: Self-pay | Admitting: Oncology

## 2024-03-31 DIAGNOSIS — Z8669 Personal history of other diseases of the nervous system and sense organs: Secondary | ICD-10-CM

## 2024-03-31 DIAGNOSIS — H539 Unspecified visual disturbance: Secondary | ICD-10-CM
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
## 2024-04-02 ENCOUNTER — Telehealth: Payer: Self-pay

## 2024-04-02 NOTE — Telephone Encounter (Signed)
 Referral faxed to [facility] Neuro [facility]hthal[facility] as requested. 9Fax: [phone]) Patient aware to expect call from their office to schedule.
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
## 2024-04-13 DIAGNOSIS — H01003 Unspecified blepharitis right eye, unspecified eyelid: Secondary | ICD-10-CM | POA: Diagnosis not present

## 2024-04-13 DIAGNOSIS — H04123 Dry eye syndrome of bilateral lacrimal glands: Secondary | ICD-10-CM | POA: Diagnosis not present

## 2024-04-13 DIAGNOSIS — H01006 Unspecified blepharitis left eye, unspecified eyelid: Secondary | ICD-10-CM | POA: Diagnosis not present

## 2024-04-13 DIAGNOSIS — H43813 Vitreous degeneration, bilateral: Secondary | ICD-10-CM | POA: Diagnosis not present

## 2024-04-20 ENCOUNTER — Other Ambulatory Visit: Payer: Self-pay | Admitting: Family Medicine

## 2024-04-20 DIAGNOSIS — K219 Gastro-esophageal reflux disease without esophagitis: Secondary | ICD-10-CM

## 2024-06-07 ENCOUNTER — Other Ambulatory Visit: Payer: Self-pay | Admitting: Family Medicine

## 2024-06-07 DIAGNOSIS — F419 Anxiety disorder, unspecified: Secondary | ICD-10-CM

## 2024-06-15 DIAGNOSIS — M542 Cervicalgia: Secondary | ICD-10-CM | POA: Diagnosis not present

## 2024-06-28 ENCOUNTER — Other Ambulatory Visit: Payer: Self-pay | Admitting: Family Medicine

## 2024-06-28 DIAGNOSIS — I10 Essential (primary) hypertension: Secondary | ICD-10-CM

## 2024-06-29 NOTE — Telephone Encounter (Signed)
 Requested medication (s) are due for refill today:   Yes  Requested medication (s) are on the active medication list:   Yes  Future visit scheduled:   No.    Last ordered: 01/07/2024 #90, 1 refill  Unable to refill because labs are due.      Requested Prescriptions  Pending Prescriptions Disp Refills   valsartan  (DIOVAN ) 80 MG tablet [Pharmacy Med Name: VALSARTAN  80 MG TABLET] 90 tablet 1    Sig: TAKE 1 TABLET BY MOUTH EVERY DAY     Cardiovascular:  Angiotensin Receptor Blockers Failed - 06/29/2024  3:14 PM      Failed - Cr in normal range and within 180 days    Creatinine  Date Value Ref Range Status  02/11/2012 0.53 (L) 0.60 - 1.30 mg/dL Final   Creatinine, Ser  Date Value Ref Range Status  11/25/2023 0.58 0.57 - 1.00 mg/dL Final         Failed - K in normal range and within 180 days    Potassium  Date Value Ref Range Status  11/25/2023 4.4 3.5 - 5.2 mmol/L Final  02/11/2012 4.4 3.5 - 5.1 mmol/L Final         Passed - Patient is not pregnant      Passed - Last BP in normal range    BP Readings from Last 1 Encounters:  03/04/24 139/68         Passed - Valid encounter within last 6 months    Recent Outpatient Visits           5 months ago Anxiety   [facility] [place], [place], [place]   6 months ago Benign essential HTN   [facility] [place], [place], [place]   7 months ago Primary hypertension   [facility] [place] Nancyann B[place], MD

## 2024-07-27 ENCOUNTER — Encounter: Payer: Self-pay | Admitting: Obstetrics and Gynecology

## 2024-08-11 ENCOUNTER — Ambulatory Visit (INDEPENDENT_AMBULATORY_CARE_PROVIDER_SITE_OTHER): Admitting: Obstetrics and Gynecology

## 2024-08-11 ENCOUNTER — Encounter: Payer: Self-pay | Admitting: Obstetrics and Gynecology

## 2024-08-11 VITALS — BP 108/69 | HR 55 | Ht 62.0 in | Wt 215.0 lb

## 2024-08-11 DIAGNOSIS — Z01419 Encounter for gynecological examination (general) (routine) without abnormal findings: Secondary | ICD-10-CM

## 2024-08-11 DIAGNOSIS — R3915 Urgency of urination: Secondary | ICD-10-CM | POA: Diagnosis not present

## 2024-08-11 DIAGNOSIS — Z780 Asymptomatic menopausal state: Secondary | ICD-10-CM

## 2024-08-11 DIAGNOSIS — N951 Menopausal and female climacteric states: Secondary | ICD-10-CM

## 2024-08-11 DIAGNOSIS — Z1231 Encounter for screening mammogram for malignant neoplasm of breast: Secondary | ICD-10-CM

## 2024-08-11 DIAGNOSIS — N644 Mastodynia: Secondary | ICD-10-CM

## 2024-08-11 LAB — POCT URINALYSIS DIPSTICK
Bilirubin, UA: NEGATIVE
Blood, UA: NEGATIVE
Glucose, UA: NEGATIVE
Ketones, UA: NEGATIVE
Leukocytes, UA: NEGATIVE
Nitrite, UA: NEGATIVE
Protein, UA: NEGATIVE
Spec Grav, UA: 1.02 (ref 1.010–1.025)
pH, UA: 5 (ref 5.0–8.0)

## 2024-08-11 NOTE — Patient Instructions (Addendum)
 I value your feedback and you entrusting [place] with your care. If you get a [facility] patient survey, I would appreciate you taking the time to let us know about your experience today. Thank you!  [facility] ([place])--[phone]
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
## 2024-08-12 LAB — FSH/LH
FSH: 24.7 m[IU]/mL
LH: 46.4 m[IU]/mL

## 2024-08-12 LAB — ESTRADIOL: Estradiol: 174 pg/mL

## 2024-08-13 ENCOUNTER — Ambulatory Visit: Payer: Self-pay | Admitting: Obstetrics and Gynecology

## 2024-08-13 LAB — URINE CULTURE: Organism ID, Bacteria: NO GROWTH

## 2024-08-17 DIAGNOSIS — M542 Cervicalgia: Secondary | ICD-10-CM | POA: Diagnosis not present

## 2024-08-17 DIAGNOSIS — M4802 Spinal stenosis, cervical region: Secondary | ICD-10-CM | POA: Diagnosis not present

## 2024-08-17 DIAGNOSIS — M5010 Cervical disc disorder with radiculopathy, unspecified cervical region: Secondary | ICD-10-CM | POA: Diagnosis not present

## 2024-08-17 DIAGNOSIS — Z79899 Other long term (current) drug therapy: Secondary | ICD-10-CM | POA: Diagnosis not present

## 2024-08-17 DIAGNOSIS — M47812 Spondylosis without myelopathy or radiculopathy, cervical region: Secondary | ICD-10-CM | POA: Diagnosis not present

## 2024-08-17 DIAGNOSIS — M5412 Radiculopathy, cervical region: Secondary | ICD-10-CM | POA: Diagnosis not present

## 2024-08-17 DIAGNOSIS — M25512 Pain in left shoulder: Secondary | ICD-10-CM | POA: Diagnosis not present

## 2024-08-17 DIAGNOSIS — M4722 Other spondylosis with radiculopathy, cervical region: Secondary | ICD-10-CM | POA: Diagnosis not present

## 2024-08-19 DIAGNOSIS — Z9884 Bariatric surgery status: Secondary | ICD-10-CM | POA: Diagnosis not present

## 2024-08-19 DIAGNOSIS — M542 Cervicalgia: Secondary | ICD-10-CM | POA: Diagnosis not present

## 2024-08-19 DIAGNOSIS — Z6837 Body mass index (BMI) 37.0-37.9, adult: Secondary | ICD-10-CM | POA: Diagnosis not present

## 2024-08-21 DIAGNOSIS — Z9884 Bariatric surgery status: Secondary | ICD-10-CM | POA: Diagnosis not present

## 2024-09-16 DIAGNOSIS — Z9884 Bariatric surgery status: Secondary | ICD-10-CM | POA: Diagnosis not present

## 2024-09-16 DIAGNOSIS — E669 Obesity, unspecified: Secondary | ICD-10-CM | POA: Diagnosis not present

## 2024-09-16 DIAGNOSIS — Z6837 Body mass index (BMI) 37.0-37.9, adult: Secondary | ICD-10-CM | POA: Diagnosis not present

## 2024-09-16 DIAGNOSIS — E560 Deficiency of vitamin E: Secondary | ICD-10-CM | POA: Diagnosis not present

## 2024-09-24 ENCOUNTER — Ambulatory Visit (INDEPENDENT_AMBULATORY_CARE_PROVIDER_SITE_OTHER): Payer: Self-pay | Admitting: Adult Health

## 2024-09-24 ENCOUNTER — Encounter: Payer: Self-pay | Admitting: Adult Health

## 2024-09-24 ENCOUNTER — Other Ambulatory Visit: Payer: Self-pay

## 2024-09-24 VITALS — BP 124/72 | HR 69 | Temp 96.9°F | Ht 62.0 in | Wt 197.0 lb

## 2024-09-24 DIAGNOSIS — J029 Acute pharyngitis, unspecified: Secondary | ICD-10-CM

## 2024-09-24 LAB — POCT RAPID STREP A (OFFICE): Rapid Strep A Screen: NEGATIVE

## 2024-09-24 LAB — POC SOFIA 2 FLU + SARS ANTIGEN FIA
Influenza A, POC: NEGATIVE
Influenza B, POC: NEGATIVE
SARS Coronavirus 2 Ag: NEGATIVE

## 2024-09-24 MED ORDER — AMOXICILLIN-POT CLAVULANATE 875-125 MG PO TABS: 1.0000 | ORAL_TABLET | Freq: Two times a day (BID) | ORAL | 0 refills | Status: DC

## 2024-09-24 NOTE — Progress Notes (Signed)
 Therapist, [facility] [address] [place], [place] [address]   Office Visit Note  Patient Name: Mariah Martin Date of Birth [id number]  Medical Record number [id number]  Date of Service: 09/24/2024  Chief Complaint  Patient presents with   Sore Throat    Sore throat x 3 days and PND. Pain is 7/10.She has used nasal spray and did a salt water gargle.     Sore Throat  Pertinent negatives include no coughing, diarrhea or vomiting.   Pt is here for a sick visit. She reports about 3 days of sore throat, and PND.  Last night she started having left ear popping.  She noticed a small area in her throat that looked like a blood blister  She used a little vacuum that is made for removing tonsil stones. She used it on the area, and puss came out.    Current Medication:  Outpatient Encounter Medications as of 09/24/2024  Medication Sig   amoxicillin -clavulanate (AUGMENTIN ) 875-125 MG tablet Take 1 tablet by mouth 2 (two) times daily.   busPIRone  (BUSPAR ) 5 MG tablet TAKE 1 TABLET BY MOUTH TWICE A DAY   esomeprazole  (NEXIUM ) 40 MG capsule TAKE 1 CAPSULE (40 MG TOTAL) BY MOUTH DAILY AT 12 NOON.   Multiple Vitamin (MULTI-VITAMIN) tablet Take 1 tablet by mouth daily.    valsartan  (DIOVAN ) 80 MG tablet TAKE 1 TABLET BY MOUTH EVERY DAY   Vitamin D -Vitamin K (VITAMIN K2-VITAMIN D3 PO) Take by mouth. 5000  100 MCG   pregabalin (LYRICA) 50 MG capsule Take 50 mg by mouth 2 (two) times daily. (Patient not taking: Reported on 09/24/2024)   No facility-administered encounter medications on file as of 09/24/2024.      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  HENT:  Positive for postnasal drip, rhinorrhea and sore throat.   Eyes:  Negative for pain and redness.  Respiratory:  Negative for cough.   Cardiovascular:  Negative for chest pain.  Gastrointestinal:  Negative for diarrhea, nausea and vomiting.  Hematological:  Positive for adenopathy.    Physical Exam Vitals reviewed.  Constitutional:      Appearance: She is well-developed.  HENT:     Head: Normocephalic.     Right Ear: Tympanic membrane and ear canal normal.     Left Ear: Tympanic membrane and ear canal normal.  Nose: Nose normal.     Mouth/Throat:     Mouth: Mucous membranes are moist.   Eyes:     Pupils: Pupils are equal, round, and reactive to light.  Pulmonary:     Effort: Pulmonary effort is normal.  Lymphadenopathy:     Cervical: No cervical adenopathy.  Neurological:     Mental Status: She is alert.    Results for orders placed or performed in visit on 09/24/24 (from the past 24 hours)  POC SOFIA 2 FLU + SARS ANTIGEN FIA     Status: None   Collection Time: 09/24/24  8:05 AM  Result Value Ref Range   Influenza A, POC Negative Negative   Influenza B, POC Negative Negative   SARS Coronavirus 2 Ag Negative Negative  POCT rapid strep A     Status: None   Collection Time: 09/24/24  8:05 AM  Result Value Ref  Range   Rapid Strep A Screen Negative Negative    Assessment/Plan: 1. Sore throat (Primary) Finish all antibiotics as prescribed, even when you are feeling better.  TAke antibiotic with food.  Take an over-the-counter pain reliever/anti-inflammatory (such as ibuprofen ) to help relieve pain or fever.  Rest and drink plenty of water.  Drinking warm or cold liquids (such as tea, soup or smoothies) and eating soft foods (such as oatmeal) may be more comfortable until your throat pain improves.  Do not share cups/water bottles/ utensils with others.  Wash your hands or use hand sanitizer often, especially before/after eating and after coughing into your hand or blowing your nose.  Send secure message to provider or schedule return appointment as needed for new/worsening symptoms (such as increased throat pain) or if your symptoms are not improving after 2-3 days on the antibiotic.    - POC SOFIA 2 FLU + SARS ANTIGEN FIA - POCT rapid strep A - amoxicillin -clavulanate (AUGMENTIN ) 875-125 MG tablet; Take 1 tablet by mouth 2 (two) times daily.  Dispense: 20 tablet; Refill: 0     General Counseling: Mariah Martin verbalizes understanding of the findings of todays visit and agrees with plan of treatment. I have discussed any further diagnostic evaluation that may be needed or ordered today. We also reviewed her medications today. she has been encouraged to call the office with any questions or concerns that should arise related to todays visit.   Orders Placed This Encounter  Procedures   POC SOFIA 2 FLU + SARS ANTIGEN FIA   POCT rapid strep A    Meds ordered this encounter  Medications   amoxicillin -clavulanate (AUGMENTIN ) 875-125 MG tablet    Sig: Take 1 tablet by mouth 2 (two) times daily.    Dispense:  20 tablet    Refill:  0    Time spent:15 Minutes    Juliene DOROTHA Howells AGNP-C Nurse Practitioner

## 2024-09-26 ENCOUNTER — Other Ambulatory Visit: Payer: Self-pay | Admitting: Family Medicine

## 2024-09-26 DIAGNOSIS — I10 Essential (primary) hypertension: Secondary | ICD-10-CM

## 2024-09-27 ENCOUNTER — Encounter: Payer: Self-pay | Admitting: Family Medicine

## 2024-09-28 ENCOUNTER — Other Ambulatory Visit: Payer: Self-pay | Admitting: Family Medicine

## 2024-09-28 DIAGNOSIS — I10 Essential (primary) hypertension: Secondary | ICD-10-CM

## 2024-09-28 NOTE — Telephone Encounter (Unsigned)
 Copied from CRM #[id number]. Topic: Clinical - Medication Refill >> Sep 28, 2024  9:40 AM Shereese L wrote: Medication:  valsartan  (DIOVAN ) 80 MG tablet   Has the patient contacted their pharmacy? Yes (Agent: If no, request that the patient contact the pharmacy for the refill. If patient does not wish to contact the pharmacy document the reason why and proceed with request.) (Agent: If yes, when and what did the pharmacy advise?)  This is the patient's preferred pharmacy:  [facility] [address] [place] - [address] [place] [address] Phone: [phone] Fax: [phone]  Is this the correct pharmacy for this prescription? Yes If no, delete pharmacy and type the correct one.   Has the prescription been filled recently? Yes  Is the patient out of the medication? Yes  Has the patient been seen for an appointment in the last year OR does the patient have an upcoming appointment? Yes  Can we respond through MyChart? Yes  Agent: Please be advised that Rx refills may take up to 3 business days. We ask that you follow-up with your pharmacy.

## 2024-09-29 ENCOUNTER — Other Ambulatory Visit: Payer: Self-pay

## 2024-09-29 ENCOUNTER — Other Ambulatory Visit: Payer: Self-pay | Admitting: Family Medicine

## 2024-09-29 ENCOUNTER — Encounter: Payer: Self-pay | Admitting: Family Medicine

## 2024-09-29 ENCOUNTER — Other Ambulatory Visit (HOSPITAL_COMMUNITY): Payer: Self-pay

## 2024-09-29 DIAGNOSIS — I10 Essential (primary) hypertension: Secondary | ICD-10-CM

## 2024-09-29 MED ORDER — VALSARTAN 80 MG PO TABS: 80.0000 mg | ORAL_TABLET | Freq: Every day | ORAL | 0 refills | Status: DC

## 2024-09-30 ENCOUNTER — Other Ambulatory Visit (HOSPITAL_COMMUNITY): Payer: Self-pay

## 2024-09-30 ENCOUNTER — Ambulatory Visit: Admitting: Family Medicine

## 2024-09-30 ENCOUNTER — Telehealth: Payer: Self-pay | Admitting: Pharmacy Technician

## 2024-09-30 NOTE — Telephone Encounter (Signed)
 PA request has been Received. New Encounter has been or will be created for follow up. For additional info see Pharmacy Prior Auth telephone encounter from 09/30/2024.

## 2024-09-30 NOTE — Telephone Encounter (Signed)
 Noted

## 2024-09-30 NOTE — Telephone Encounter (Signed)
 Pharmacy Patient Advocate Encounter   Received notification from Patient Advice Request messages that prior authorization for Valsartan  80mg  tablets is required/requested.   Insurance verification completed.   The patient is insured through [facility].   Per test claim: Refill too soon. PA is not needed at this time. Medication was filled 09/29/2024. Next eligible fill date is 10/22/2024.

## 2024-10-09 ENCOUNTER — Encounter

## 2024-10-14 ENCOUNTER — Other Ambulatory Visit: Payer: Self-pay | Admitting: Family Medicine

## 2024-10-14 DIAGNOSIS — K219 Gastro-esophageal reflux disease without esophagitis: Secondary | ICD-10-CM

## 2024-10-16 ENCOUNTER — Ambulatory Visit
Admission: RE | Admit: 2024-10-16 | Discharge: 2024-10-16 | Disposition: A | Source: Ambulatory Visit | Attending: Obstetrics and Gynecology

## 2024-10-16 DIAGNOSIS — Z1231 Encounter for screening mammogram for malignant neoplasm of breast: Secondary | ICD-10-CM | POA: Diagnosis present

## 2024-10-22 ENCOUNTER — Encounter: Payer: Self-pay | Admitting: Family Medicine

## 2024-10-22 ENCOUNTER — Telehealth: Admitting: Family Medicine

## 2024-10-22 VITALS — BP 121/72

## 2024-10-22 DIAGNOSIS — K219 Gastro-esophageal reflux disease without esophagitis: Secondary | ICD-10-CM | POA: Diagnosis not present

## 2024-10-22 DIAGNOSIS — F419 Anxiety disorder, unspecified: Secondary | ICD-10-CM

## 2024-10-22 DIAGNOSIS — I10 Essential (primary) hypertension: Secondary | ICD-10-CM

## 2024-10-22 DIAGNOSIS — E559 Vitamin D deficiency, unspecified: Secondary | ICD-10-CM | POA: Diagnosis not present

## 2024-10-22 MED ORDER — VALSARTAN 40 MG PO TABS: ORAL_TABLET | ORAL | 0 refills | Status: DC

## 2024-10-22 NOTE — Progress Notes (Signed)
 "                    MyChart Video Visit    Virtual Visit via Video Note   This format is felt to be most appropriate for this patient at this time. Physical exam was limited by quality of the video and audio technology used for the visit.    Patient location: home Provider location: [facility] Persons involved in the visit: patient, provider  I discussed the limitations of evaluation and management by telemedicine and the availability of in person appointments. The patient expressed understanding and agreed to proceed.  [name]   DOB: 08-05-1976   49 y.o. Female  MRN: [id number] Visit Date: 10/22/2024  Today's healthcare provider: Rockie Agent, MD   Chief Complaint  Patient presents with   Hypertension   Anxiety    Would like to DC buspar  if possible     Subjective:    Hypertension Associated symptoms include anxiety.  Anxiety      Discussed the use of AI scribe software for clinical note transcription with the patient, who gave verbal consent to proceed.  History of Present Illness Mariah Martin is a 49 year old female who presents for a follow-up visit.  She has chronic anxiety and is currently taking buspirone  5 mg twice daily. She has not experienced any anxiety attacks recently. She is also on tirzepatide 7.5 mg for weight loss, which she believes may be contributing to her improved anxiety symptoms.  She has chronic hypertension and is currently on valsartan  80 mg daily. She regularly monitors her blood pressure, which has been stable, with recent readings ranging from 109/72 to 130/72.  She is on Nexium  40 mg daily for chronic reflux and reports that it is effective in managing her symptoms. She describes Nexium  as 'my best friend'.  She is taking vitamin D  and has recently resumed calcium and vitamin E supplementation after an extensive blood panel. She is not currently taking a B vitamin but is  on a multivitamin.   ROS  Last metabolic panel Lab Results  Component Value Date   GLUCOSE 88 11/25/2023   NA 141 11/25/2023   K 4.4 11/25/2023   CL 104 11/25/2023   CO2 24 11/25/2023   BUN 12 11/25/2023   CREATININE 0.58 11/25/2023   EGFR 111 11/25/2023   CALCIUM 9.9 11/25/2023   PHOS 3.1 11/25/2023   PROT 6.1 11/05/2023   ALBUMIN 4.2 11/25/2023   LABGLOB 2.2 11/05/2023   AGRATIO 2.1 08/07/2022   BILITOT 0.3 11/05/2023   ALKPHOS 83 11/05/2023   AST 20 11/05/2023   ALT 15 11/05/2023   ANIONGAP 8 08/23/2020   Last lipids Lab Results  Component Value Date   CHOL 210 (H) 11/11/2023   HDL 106 11/11/2023   LDLCALC 87 11/11/2023   TRIG 101 11/11/2023   CHOLHDL 2.0 11/11/2023   Last hemoglobin A1c Lab Results  Component Value Date   HGBA1C 4.7 (L) 11/05/2023   Last thyroid  functions Lab Results  Component Value Date   TSH 1.960 11/05/2023   FREET4 1.30 11/05/2023   Last vitamin D  Lab Results  Component Value Date   VD25OH 50.2 11/05/2023   Last vitamin B12 and Folate Lab Results  Component Value Date   VITAMINB12 1,603 (H) 11/05/2023         Objective:    BP 121/72 (Cuff Size: Normal) Comment: home BP report      Physical  Exam Vitals reviewed.  Constitutional:      General: She is not in acute distress.    Appearance: Normal appearance. She is not ill-appearing.  Pulmonary:     Effort: Pulmonary effort is normal. No respiratory distress.  Neurological:     Mental Status: She is alert and oriented to person, place, and time.  Psychiatric:        Mood and Affect: Mood normal.        Behavior: Behavior normal.        Thought Content: Thought content normal.         Assessment & Plan:    Problem List Items Addressed This Visit     Anxiety   Avitaminosis D   Benign essential HTN - Primary   Relevant Medications   valsartan  (DIOVAN ) 40 MG tablet   Gastroesophageal reflux disease    Assessment and Plan Assessment & Plan Essential  hypertension Chronic  Blood pressure is well-controlled with valsartan  80 mg daily. Recent readings range from 109/70 to 130/70. She is interested in tapering off medication due to stable readings and weight loss efforts with tirzepatide. - Reduced valsartan  to 40 mg daily for one month. - Monitor blood pressure for one month. - If blood pressure remains stable, reduce valsartan  to every other day for two weeks. - If blood pressure remains stable, discontinue valsartan . - If blood pressure increases to 138/86 or higher, resume valsartan  80 mg daily. - Sent prescription for valsartan  40 mg to pharmacy.  Anxiety disorder chronic No recent anxiety attacks reported. She is interested in discontinuing buspirone  due to well-managed symptoms. - Discontinued buspirone . - Monitor for recurrence of anxiety symptoms.  Gastroesophageal reflux disease Chronic  Nexium  40 mg daily is being used for reflux symptoms. - Continue Nexium  40 mg daily.  Vitamin D  deficiency Chronic  Vitamin D  deficiency is managed with supplementation. - Continue current vitamin D  supplementation.    Meds ordered this encounter  Medications   valsartan  (DIOVAN ) 40 MG tablet    Sig: Take 1 tablet (40 mg total) by mouth daily for 28 days, THEN 1 tablet (40 mg total) every other day for 28 days.    Dispense:  42 tablet    Refill:  0    Please advise patient appointment is needed for further refills.     Return in about 3 months (around 01/20/2025) for Anxiety, HTN.     I discussed the assessment and treatment plan with the patient. The patient was provided an opportunity to ask questions and all were answered. The patient agreed with the plan and demonstrated an understanding of the instructions.   The patient was advised to call back or seek an in-person evaluation if the symptoms worsen or if the condition fails to improve as anticipated.  I provided 15 minutes of non-face-to-face time during this  encounter.  Rockie Agent, MD [facility]    "

## 2024-10-22 NOTE — Patient Instructions (Signed)
 To keep you healthy, please keep in mind the following health maintenance items that you are due for:   Health Maintenance Due  Topic Date Due   Hepatitis B Vaccines 19-59 Average Risk (1 of 3 - 19+ 3-dose series) Never done   DTaP/Tdap/Td (8 - Td or Tdap) 04/04/2024   Pneumococcal Vaccine: 50+ Years (2 of 2 - PCV) 05/18/2024     Best Wishes,   Dr. Lang

## 2024-11-07 ENCOUNTER — Encounter: Payer: Self-pay | Admitting: Family Medicine

## 2024-11-07 DIAGNOSIS — I10 Essential (primary) hypertension: Secondary | ICD-10-CM

## 2024-11-09 MED ORDER — VALSARTAN 40 MG PO TABS: 40.0000 mg | ORAL_TABLET | Freq: Every day | ORAL | 1 refills | Status: AC

## 2025-02-04 ENCOUNTER — Ambulatory Visit: Admitting: Family Medicine
